# Patient Record
Sex: Male | Born: 1955 | Race: White | Hispanic: Yes | Marital: Married | Smoking: Former smoker
Health system: Southern US, Community
[De-identification: ages and names within clinical notes are randomized; demographics above are authoritative.]

## PROBLEM LIST (undated history)

## (undated) DIAGNOSIS — E119 Type 2 diabetes mellitus without complications: Secondary | ICD-10-CM

## (undated) DIAGNOSIS — I10 Essential (primary) hypertension: Secondary | ICD-10-CM

## (undated) HISTORY — PX: CHOLECYSTECTOMY: SHX55

## (undated) HISTORY — PX: HERNIA REPAIR: SHX51

---

## 2015-06-03 ENCOUNTER — Encounter (HOSPITAL_COMMUNITY): Payer: Self-pay

## 2015-06-03 ENCOUNTER — Emergency Department (HOSPITAL_COMMUNITY)
Admission: EM | Admit: 2015-06-03 | Discharge: 2015-06-03 | Disposition: A | Discharge status: Home / Self Care | Payer: BLUE CROSS/BLUE SHIELD | Attending: Physician Assistant | Admitting: Physician Assistant

## 2015-06-03 DIAGNOSIS — R197 Diarrhea, unspecified: Secondary | ICD-10-CM | POA: Diagnosis not present

## 2015-06-03 DIAGNOSIS — N390 Urinary tract infection, site not specified: Secondary | ICD-10-CM | POA: Diagnosis not present

## 2015-06-03 DIAGNOSIS — Z87891 Personal history of nicotine dependence: Secondary | ICD-10-CM | POA: Diagnosis not present

## 2015-06-03 DIAGNOSIS — Z79899 Other long term (current) drug therapy: Secondary | ICD-10-CM | POA: Insufficient documentation

## 2015-06-03 DIAGNOSIS — I10 Essential (primary) hypertension: Secondary | ICD-10-CM | POA: Diagnosis not present

## 2015-06-03 DIAGNOSIS — E119 Type 2 diabetes mellitus without complications: Secondary | ICD-10-CM | POA: Insufficient documentation

## 2015-06-03 DIAGNOSIS — R112 Nausea with vomiting, unspecified: Secondary | ICD-10-CM | POA: Diagnosis present

## 2015-06-03 HISTORY — DX: Type 2 diabetes mellitus without complications: E11.9

## 2015-06-03 HISTORY — DX: Essential (primary) hypertension: I10

## 2015-06-03 LAB — CBC WITH DIFFERENTIAL/PLATELET
Basophils Absolute: 0 10*3/uL (ref 0.0–0.1)
Basophils Relative: 0 % (ref 0–1)
EOS PCT: 1 % (ref 0–5)
Eosinophils Absolute: 0.1 10*3/uL (ref 0.0–0.7)
HEMATOCRIT: 43.9 % (ref 39.0–52.0)
Hemoglobin: 15.7 g/dL (ref 13.0–17.0)
LYMPHS ABS: 0.8 10*3/uL (ref 0.7–4.0)
LYMPHS PCT: 14 % (ref 12–46)
MCH: 31.7 pg (ref 26.0–34.0)
MCHC: 35.8 g/dL (ref 30.0–36.0)
MCV: 88.7 fL (ref 78.0–100.0)
MONO ABS: 0.3 10*3/uL (ref 0.1–1.0)
MONOS PCT: 5 % (ref 3–12)
Neutro Abs: 4.5 10*3/uL (ref 1.7–7.7)
Neutrophils Relative %: 80 % — ABNORMAL HIGH (ref 43–77)
PLATELETS: 317 10*3/uL (ref 150–400)
RBC: 4.95 MIL/uL (ref 4.22–5.81)
RDW: 13.3 % (ref 11.5–15.5)
WBC: 5.6 10*3/uL (ref 4.0–10.5)

## 2015-06-03 LAB — URINE MICROSCOPIC-ADD ON

## 2015-06-03 LAB — CBG MONITORING, ED
GLUCOSE-CAPILLARY: 204 mg/dL — AB (ref 65–99)
GLUCOSE-CAPILLARY: 275 mg/dL — AB (ref 65–99)
Glucose-Capillary: 307 mg/dL — ABNORMAL HIGH (ref 65–99)

## 2015-06-03 LAB — URINALYSIS, ROUTINE W REFLEX MICROSCOPIC
Bilirubin Urine: NEGATIVE
Hgb urine dipstick: NEGATIVE
Ketones, ur: NEGATIVE mg/dL
Nitrite: NEGATIVE
PH: 5 (ref 5.0–8.0)
Protein, ur: NEGATIVE mg/dL
Specific Gravity, Urine: 1.028 (ref 1.005–1.030)
Urobilinogen, UA: 0.2 mg/dL (ref 0.0–1.0)

## 2015-06-03 LAB — COMPREHENSIVE METABOLIC PANEL
ALT: 91 U/L — ABNORMAL HIGH (ref 17–63)
AST: 53 U/L — ABNORMAL HIGH (ref 15–41)
Albumin: 4.4 g/dL (ref 3.5–5.0)
Alkaline Phosphatase: 71 U/L (ref 38–126)
Anion gap: 10 (ref 5–15)
BUN: 51 mg/dL — ABNORMAL HIGH (ref 6–20)
CO2: 19 mmol/L — ABNORMAL LOW (ref 22–32)
Calcium: 9.4 mg/dL (ref 8.9–10.3)
Chloride: 103 mmol/L (ref 101–111)
Creatinine, Ser: 1.11 mg/dL (ref 0.61–1.24)
GFR calc Af Amer: >60 mL/min (ref >60)
GFR calc non Af Amer: >60 mL/min (ref >60)
Glucose, Bld: 346 mg/dL — ABNORMAL HIGH (ref 65–99)
Potassium: 5.4 mmol/L — ABNORMAL HIGH (ref 3.5–5.1)
Sodium: 132 mmol/L — ABNORMAL LOW (ref 135–145)
Total Bilirubin: 1.5 mg/dL — ABNORMAL HIGH (ref 0.3–1.2)
Total Protein: 8.4 g/dL — ABNORMAL HIGH (ref 6.5–8.1)

## 2015-06-03 LAB — LIPASE, BLOOD: Lipase: 55 U/L — ABNORMAL HIGH (ref 22–51)

## 2015-06-03 MED ORDER — CIPROFLOXACIN HCL 500 MG PO TABS: 500.0000 mg | ORAL_TABLET | Freq: Two times a day (BID) | ORAL | Status: DC

## 2015-06-03 MED ORDER — ONDANSETRON HCL 4 MG/2ML IJ SOLN
4.0000 mg | Freq: Once | INTRAMUSCULAR | Status: AC
  Administered 2015-06-03: 4 mg via INTRAVENOUS
  Filled 2015-06-03: qty 2

## 2015-06-03 MED ORDER — SODIUM CHLORIDE 0.9 % IV BOLUS (SEPSIS)
1000.0000 mL | Freq: Once | INTRAVENOUS | Status: AC
  Administered 2015-06-03: 1000 mL via INTRAVENOUS

## 2015-06-03 NOTE — Discharge Instructions (Signed)
Take all of your antibiotic as prescribed. Return to ED for new or worsening symptoms. Follow-up with your doctor as needed for reevaluation of your symptoms.  Urinary Tract Infection A urinary tract infection (UTI) can occur any place along the urinary tract. The tract includes the kidneys, ureters, bladder, and urethra. A type of germ called bacteria often causes a UTI. UTIs are often helped with antibiotic medicine.  HOME CARE   If given, take antibiotics as told by your doctor. Finish them even if you start to feel better.  Drink enough fluids to keep your pee (urine) clear or pale yellow.  Avoid tea, drinks with caffeine, and bubbly (carbonated) drinks.  Pee often. Avoid holding your pee in for a long time.  Pee before and after having sex (intercourse).  Wipe from front to back after you poop (bowel movement) if you are a woman. Use each tissue only once. GET HELP RIGHT AWAY IF:   You have back pain.  You have lower belly (abdominal) pain.  You have chills.  You feel sick to your stomach (nauseous).  You throw up (vomit).  Your burning or discomfort with peeing does not go away.  You have a fever.  Your symptoms are not better in 3 days. MAKE SURE YOU:   Understand these instructions.  Will watch your condition.  Will get help right away if you are not doing well or get worse. Document Released: 04/08/2008 Document Revised: 07/15/2012 Document Reviewed: 05/21/2012 [facility] Patient Information 2015 [place], [place]. This information is not intended to replace advice given to you by your health care provider. Make sure you discuss any questions you have with your health care provider.

## 2015-06-03 NOTE — ED Notes (Signed)
Pt given sprite and tolerated it. No vomiting.

## 2015-06-03 NOTE — ED Notes (Signed)
He states he has had some epigastric discomfort, plus n/v/d since this Tuesday.  He is in no distress.

## 2015-06-03 NOTE — ED Provider Notes (Signed)
CSN: [id number]     Arrival date & time 06/03/15  1226 History   First MD Initiated Contact with Patient 06/03/15 1230     Chief Complaint  Patient presents with  . Emesis     (Consider location/radiation/quality/duration/timing/severity/associated sxs/prior Treatment) HPI Matthew Kerr is a 59 y.o. male history of diabetes and hypertension who comes in for evaluation of abdominal discomfort, nausea vomiting and diarrhea. Patient states he has had abdominal discomfort that started on Thursday, was fine yesterday, but abdominal discomfort returned today. He cannot characterize this sensation, but reports every time he eats he either "throws up or has diarrhea". He denies any bloody emesis. He reports dark stools yesterday and earlier today, but also reports he has been taking Pepto-Bismol. He reports the Pepto-Bismol helped with his discomfort somewhat. He denies any discomfort now in the ED. Denies fevers, chills, urinary symptoms, chest pain, shortness of breath, numbness or weakness. Denies any recent travel.  Past Medical History  Diagnosis Date  . Diabetes mellitus without complication   . Hypertension    Past Surgical History  Procedure Laterality Date  . Cholecystectomy    . Hernia repair     History reviewed. No pertinent family history. History  Substance Use Topics  . Smoking status: Former Games developer  . Smokeless tobacco: Not on file  . Alcohol Use: No    Review of Systems A 10 point review of systems was completed and was negative except for pertinent positives and negatives as mentioned in the history of present illness     Allergies  Review of patient's allergies indicates no known allergies.  Home Medications   Prior to Admission medications   Medication Sig Start Date End Date Taking? Authorizing Provider  glipiZIDE (GLUCOTROL) 10 MG tablet Take 10 mg by mouth 2 (two) times daily before a meal.  04/09/15  Yes Historical Provider, MD  lisinopril  (PRINIVIL,ZESTRIL) 20 MG tablet Take 20 mg by mouth daily.  05/14/15  Yes Historical Provider, MD  metFORMIN (GLUCOPHAGE) 1000 MG tablet Take 1,000 mg by mouth 2 (two) times daily with a meal.  05/14/15  Yes Historical Provider, MD  pantoprazole (PROTONIX) 40 MG tablet Take 40 mg by mouth daily.  05/06/15  Yes Historical Provider, MD  ciprofloxacin (CIPRO) 500 MG tablet Take 1 tablet (500 mg total) by mouth 2 (two) times daily. 06/03/15   Joycie Peek, PA-C   BP 110/67 mmHg  Pulse 67  Temp(Src) 97.9 F (36.6 C) (Oral)  Resp 16  SpO2 99% Physical Exam  Constitutional: He is oriented to person, place, and time. He appears well-developed and well-nourished.  GCS 15  HENT:  Head: Normocephalic and atraumatic.  Mouth/Throat: Oropharynx is clear and moist.  Eyes: Conjunctivae are normal. Pupils are equal, round, and reactive to light. Right eye exhibits no discharge. Left eye exhibits no discharge. No scleral icterus.  Neck: Neck supple.  Cardiovascular: Normal rate, regular rhythm and normal heart sounds.   Pulmonary/Chest: Effort normal and breath sounds normal. No respiratory distress. He has no wheezes. He has no rales.  Abdominal: Soft. He exhibits no distension and no mass. There is no tenderness. There is no rebound and no guarding.  Musculoskeletal: He exhibits no tenderness.  Neurological: He is alert and oriented to person, place, and time.  Cranial Nerves II-XII grossly intact  Skin: Skin is warm and dry. No rash noted.  Psychiatric: He has a normal mood and affect.  Nursing note and vitals reviewed.   ED Course  Procedures (  including critical care time) Labs Review Labs Reviewed  CBC WITH DIFFERENTIAL/PLATELET - Abnormal; Notable for the following:    Neutrophils Relative % 80 (*)    All other components within normal limits  COMPREHENSIVE METABOLIC PANEL - Abnormal; Notable for the following:    Sodium 132 (*)    Potassium 5.4 (*)    CO2 19 (*)    Glucose, Bld 346 (*)     BUN 51 (*)    Total Protein 8.4 (*)    AST 53 (*)    ALT 91 (*)    Total Bilirubin 1.5 (*)    All other components within normal limits  LIPASE, BLOOD - Abnormal; Notable for the following:    Lipase 55 (*)    All other components within normal limits  URINALYSIS, ROUTINE W REFLEX MICROSCOPIC (NOT AT [facility]) - Abnormal; Notable for the following:    Glucose, UA >1000 (*)    Leukocytes, UA TRACE (*)    All other components within normal limits  URINE MICROSCOPIC-ADD ON - Abnormal; Notable for the following:    Casts GRANULAR CAST (*)    All other components within normal limits  CBG MONITORING, ED - Abnormal; Notable for the following:    Glucose-Capillary 307 (*)    All other components within normal limits  CBG MONITORING, ED - Abnormal; Notable for the following:    Glucose-Capillary 204 (*)    All other components within normal limits  CBG MONITORING, ED - Abnormal; Notable for the following:    Glucose-Capillary 275 (*)    All other components within normal limits    Imaging Review No results found.   EKG Interpretation None     Meds given in ED:  Medications  ondansetron (ZOFRAN) injection 4 mg (4 mg Intravenous Given 06/03/15 1329)  sodium chloride 0.9 % bolus 1,000 mL (0 mLs Intravenous Stopped 06/03/15 1413)    Discharge Medication List as of 06/03/2015  3:21 PM    START taking these medications   Details  ciprofloxacin (CIPRO) 500 MG tablet Take 1 tablet (500 mg total) by mouth 2 (two) times daily., Starting 06/03/2015, Until Discontinued, Print       Filed Vitals:   06/03/15 1238 06/03/15 1537  BP: 117/77 110/67  Pulse: 113 67  Temp: 98.1 F (36.7 C) 97.9 F (36.6 C)  TempSrc: Oral Oral  Resp: 16 16  SpO2: 97% 99%    MDM  Vitals stable -afebrile, tachycardia improving in ED with IV fluids. Pt resting comfortably in ED. states he feels much better and has no discomfort. No nausea, vomiting or diarrhea in the ED. He is tolerating PO. Suspect slight  dehydration secondary to hyperglycemia. PE--benign abdominal exam. Physical exam is otherwise not concerning. Labwork--evidence of UTI on urinalysis with trace leukocytes, 11-20 white cells. CBG 300 on arrival, on repeat CBG, improved to 204 with fluids.  Patient with known diabetes and reports he has not taken any of his medications today. He otherwise appears well and is requesting to go home. I see no emergent reason to keep him in the ED. We'll treat for UTI with Cipro and have him follow-up with primary care. I discussed all relevant lab findings and imaging results with pt and they verbalized understanding. Discussed f/u with PCP within 48 hrs and return precautions, pt very amenable to plan.   Final diagnoses:  UTI (lower urinary tract infection)       Joycie Peek, PA-C 06/04/15 1610  Courteney Randall An, MD 06/04/15  0903 

## 2015-06-22 ENCOUNTER — Encounter (HOSPITAL_COMMUNITY): Payer: Self-pay

## 2015-06-22 ENCOUNTER — Inpatient Hospital Stay (HOSPITAL_COMMUNITY): Payer: BLUE CROSS/BLUE SHIELD

## 2015-06-22 ENCOUNTER — Emergency Department (HOSPITAL_COMMUNITY): Payer: BLUE CROSS/BLUE SHIELD

## 2015-06-22 ENCOUNTER — Inpatient Hospital Stay (HOSPITAL_COMMUNITY)
Admission: EM | Admit: 2015-06-22 | Discharge: 2015-06-26 | DRG: 683 | Disposition: A | Discharge status: Home / Self Care | Payer: BLUE CROSS/BLUE SHIELD | Attending: Internal Medicine | Admitting: Internal Medicine

## 2015-06-22 DIAGNOSIS — E871 Hypo-osmolality and hyponatremia: Secondary | ICD-10-CM | POA: Diagnosis present

## 2015-06-22 DIAGNOSIS — R52 Pain, unspecified: Secondary | ICD-10-CM | POA: Diagnosis not present

## 2015-06-22 DIAGNOSIS — N170 Acute kidney failure with tubular necrosis: Secondary | ICD-10-CM | POA: Diagnosis present

## 2015-06-22 DIAGNOSIS — R509 Fever, unspecified: Secondary | ICD-10-CM

## 2015-06-22 DIAGNOSIS — E860 Dehydration: Secondary | ICD-10-CM | POA: Diagnosis present

## 2015-06-22 DIAGNOSIS — A047 Enterocolitis due to Clostridium difficile: Secondary | ICD-10-CM | POA: Diagnosis present

## 2015-06-22 DIAGNOSIS — IMO0002 Reserved for concepts with insufficient information to code with codable children: Secondary | ICD-10-CM | POA: Diagnosis present

## 2015-06-22 DIAGNOSIS — E861 Hypovolemia: Secondary | ICD-10-CM | POA: Diagnosis present

## 2015-06-22 DIAGNOSIS — E440 Moderate protein-calorie malnutrition: Secondary | ICD-10-CM | POA: Diagnosis present

## 2015-06-22 DIAGNOSIS — K219 Gastro-esophageal reflux disease without esophagitis: Secondary | ICD-10-CM | POA: Diagnosis present

## 2015-06-22 DIAGNOSIS — I959 Hypotension, unspecified: Secondary | ICD-10-CM | POA: Diagnosis present

## 2015-06-22 DIAGNOSIS — A048 Other specified bacterial intestinal infections: Secondary | ICD-10-CM | POA: Diagnosis not present

## 2015-06-22 DIAGNOSIS — E872 Acidosis: Secondary | ICD-10-CM | POA: Diagnosis present

## 2015-06-22 DIAGNOSIS — N179 Acute kidney failure, unspecified: Secondary | ICD-10-CM | POA: Diagnosis present

## 2015-06-22 DIAGNOSIS — Z79899 Other long term (current) drug therapy: Secondary | ICD-10-CM | POA: Diagnosis not present

## 2015-06-22 DIAGNOSIS — E1165 Type 2 diabetes mellitus with hyperglycemia: Secondary | ICD-10-CM | POA: Diagnosis present

## 2015-06-22 DIAGNOSIS — E875 Hyperkalemia: Secondary | ICD-10-CM | POA: Diagnosis present

## 2015-06-22 DIAGNOSIS — Z87891 Personal history of nicotine dependence: Secondary | ICD-10-CM

## 2015-06-22 DIAGNOSIS — I10 Essential (primary) hypertension: Secondary | ICD-10-CM | POA: Diagnosis present

## 2015-06-22 LAB — COMPREHENSIVE METABOLIC PANEL
ALT: 53 U/L (ref 17–63)
AST: 50 U/L — ABNORMAL HIGH (ref 15–41)
Albumin: 4.5 g/dL (ref 3.5–5.0)
Alkaline Phosphatase: 64 U/L (ref 38–126)
Anion gap: 29 — ABNORMAL HIGH (ref 5–15)
BUN: 119 mg/dL — ABNORMAL HIGH (ref 6–20)
CHLORIDE: 88 mmol/L — AB (ref 101–111)
CO2: 15 mmol/L — AB (ref 22–32)
Calcium: 8.1 mg/dL — ABNORMAL LOW (ref 8.9–10.3)
Creatinine, Ser: 11.96 mg/dL — ABNORMAL HIGH (ref 0.61–1.24)
GFR, EST AFRICAN AMERICAN: 5 mL/min — AB (ref >60)
GFR, EST NON AFRICAN AMERICAN: 4 mL/min — AB (ref >60)
Glucose, Bld: 223 mg/dL — ABNORMAL HIGH (ref 65–99)
POTASSIUM: 7 mmol/L — AB (ref 3.5–5.1)
Sodium: 132 mmol/L — ABNORMAL LOW (ref 135–145)
Total Bilirubin: 1.2 mg/dL (ref 0.3–1.2)
Total Protein: 8.3 g/dL — ABNORMAL HIGH (ref 6.5–8.1)

## 2015-06-22 LAB — GLUCOSE, CAPILLARY
GLUCOSE-CAPILLARY: 103 mg/dL — AB (ref 65–99)
Glucose-Capillary: 74 mg/dL (ref 65–99)
Glucose-Capillary: 108 mg/dL — ABNORMAL HIGH (ref 65–99)
Glucose-Capillary: 103 mg/dL — ABNORMAL HIGH (ref 65–99)
Glucose-Capillary: 95 mg/dL (ref 65–99)

## 2015-06-22 LAB — CK TOTAL AND CKMB (NOT AT ARMC)
CK TOTAL: 1850 U/L — AB (ref 49–397)
CK, MB: 40.9 ng/mL — AB (ref 0.5–5.0)
Relative Index: 2.2 (ref 0.0–2.5)

## 2015-06-22 LAB — CBC
HEMATOCRIT: 42.1 % (ref 39.0–52.0)
HEMATOCRIT: 37.6 % — AB (ref 39.0–52.0)
HEMOGLOBIN: 14.7 g/dL (ref 13.0–17.0)
HEMOGLOBIN: 13 g/dL (ref 13.0–17.0)
MCH: 30.9 pg (ref 26.0–34.0)
MCH: 30.8 pg (ref 26.0–34.0)
MCHC: 34.9 g/dL (ref 30.0–36.0)
MCHC: 34.6 g/dL (ref 30.0–36.0)
MCV: 88.6 fL (ref 78.0–100.0)
MCV: 89.1 fL (ref 78.0–100.0)
Platelets: 404 10*3/uL — ABNORMAL HIGH (ref 150–400)
Platelets: 375 10*3/uL (ref 150–400)
RBC: 4.75 MIL/uL (ref 4.22–5.81)
RBC: 4.22 MIL/uL (ref 4.22–5.81)
RDW: 13.4 % (ref 11.5–15.5)
RDW: 13.7 % (ref 11.5–15.5)
WBC: 11.3 10*3/uL — AB (ref 4.0–10.5)
WBC: 12 10*3/uL — AB (ref 4.0–10.5)

## 2015-06-22 LAB — URINE MICROSCOPIC-ADD ON

## 2015-06-22 LAB — CBG MONITORING, ED
GLUCOSE-CAPILLARY: 59 mg/dL — AB (ref 65–99)
GLUCOSE-CAPILLARY: 72 mg/dL (ref 65–99)
Glucose-Capillary: 61 mg/dL — ABNORMAL LOW (ref 65–99)

## 2015-06-22 LAB — BASIC METABOLIC PANEL
ANION GAP: 26 — AB (ref 5–15)
BUN: 123 mg/dL — ABNORMAL HIGH (ref 6–20)
CHLORIDE: 94 mmol/L — AB (ref 101–111)
CO2: 15 mmol/L — AB (ref 22–32)
Calcium: 7 mg/dL — ABNORMAL LOW (ref 8.9–10.3)
Creatinine, Ser: 11.76 mg/dL — ABNORMAL HIGH (ref 0.61–1.24)
GFR calc Af Amer: 5 mL/min — ABNORMAL LOW (ref >60)
GFR, EST NON AFRICAN AMERICAN: 4 mL/min — AB (ref >60)
GLUCOSE: 64 mg/dL — AB (ref 65–99)
POTASSIUM: 4.7 mmol/L (ref 3.5–5.1)
Sodium: 135 mmol/L (ref 135–145)

## 2015-06-22 LAB — URINALYSIS, ROUTINE W REFLEX MICROSCOPIC
GLUCOSE, UA: NEGATIVE mg/dL
Ketones, ur: NEGATIVE mg/dL
NITRITE: NEGATIVE
PH: 5 (ref 5.0–8.0)
Protein, ur: 30 mg/dL — AB
Specific Gravity, Urine: 1.021 (ref 1.005–1.030)
Urobilinogen, UA: 0.2 mg/dL (ref 0.0–1.0)

## 2015-06-22 LAB — DIFFERENTIAL
Basophils Absolute: 0 10*3/uL (ref 0.0–0.1)
Basophils Relative: 0 % (ref 0–1)
Eosinophils Absolute: 0 10*3/uL (ref 0.0–0.7)
Eosinophils Relative: 0 % (ref 0–5)
LYMPHS PCT: 17 % (ref 12–46)
Lymphs Abs: 2.1 10*3/uL (ref 0.7–4.0)
MONO ABS: 1.5 10*3/uL — AB (ref 0.1–1.0)
MONOS PCT: 12 % (ref 3–12)
NEUTROS ABS: 8.4 10*3/uL — AB (ref 1.7–7.7)
Neutrophils Relative %: 71 % (ref 43–77)

## 2015-06-22 LAB — TROPONIN I: Troponin I: <0.03 ng/mL (ref <0.031)

## 2015-06-22 LAB — C DIFFICILE QUICK SCREEN W PCR REFLEX
C DIFFICILE (CDIFF) INTERP: POSITIVE
C DIFFICILE (CDIFF) TOXIN: POSITIVE — AB
C Diff antigen: POSITIVE — AB

## 2015-06-22 LAB — MAGNESIUM: Magnesium: 1.6 mg/dL — ABNORMAL LOW (ref 1.7–2.4)

## 2015-06-22 LAB — LIPASE, BLOOD: LIPASE: 42 U/L (ref 22–51)

## 2015-06-22 LAB — CREATININE, URINE, RANDOM: Creatinine, Urine: 239.78 mg/dL

## 2015-06-22 LAB — TSH: TSH: 0.415 u[IU]/mL (ref 0.350–4.500)

## 2015-06-22 LAB — SODIUM, URINE, RANDOM: Sodium, Ur: 19 mmol/L

## 2015-06-22 MED ORDER — SODIUM CHLORIDE 0.9 % IJ SOLN
3.0000 mL | Freq: Two times a day (BID) | INTRAMUSCULAR | Status: DC
  Administered 2015-06-24: 3 mL via INTRAVENOUS

## 2015-06-22 MED ORDER — HYDROMORPHONE HCL 1 MG/ML IJ SOLN
1.0000 mg | INTRAMUSCULAR | Status: DC | PRN
  Administered 2015-06-22: 1 mg via INTRAVENOUS
  Filled 2015-06-22: qty 1

## 2015-06-22 MED ORDER — INSULIN ASPART 100 UNIT/ML ~~LOC~~ SOLN
10.0000 [IU] | Freq: Once | SUBCUTANEOUS | Status: AC
  Administered 2015-06-22: 10 [IU] via SUBCUTANEOUS
  Filled 2015-06-22: qty 1

## 2015-06-22 MED ORDER — DEXTROSE 50 % IV SOLN
1.0000 | Freq: Once | INTRAVENOUS | Status: AC
  Administered 2015-06-22: 50 mL via INTRAVENOUS
  Filled 2015-06-22: qty 50

## 2015-06-22 MED ORDER — SODIUM CHLORIDE 0.9 % IV BOLUS (SEPSIS)
1000.0000 mL | Freq: Once | INTRAVENOUS | Status: AC
  Administered 2015-06-22: 1000 mL via INTRAVENOUS

## 2015-06-22 MED ORDER — SODIUM CHLORIDE 0.9 % IV SOLN: INTRAVENOUS | Status: DC

## 2015-06-22 MED ORDER — SODIUM POLYSTYRENE SULFONATE 15 GM/60ML PO SUSP
30.0000 g | Freq: Once | ORAL | Status: AC
  Administered 2015-06-22: 30 g via ORAL
  Filled 2015-06-22: qty 120

## 2015-06-22 MED ORDER — LEVALBUTEROL HCL 0.63 MG/3ML IN NEBU: 0.6300 mg | INHALATION_SOLUTION | Freq: Four times a day (QID) | RESPIRATORY_TRACT | Status: DC | PRN

## 2015-06-22 MED ORDER — SODIUM CHLORIDE 0.9 % IV SOLN
INTRAVENOUS | Status: DC
  Filled 2015-06-22: qty 2.5

## 2015-06-22 MED ORDER — ACETAMINOPHEN 650 MG RE SUPP
650.0000 mg | Freq: Four times a day (QID) | RECTAL | Status: DC | PRN
Start: 2015-06-22 — End: 2015-06-26

## 2015-06-22 MED ORDER — STERILE WATER FOR INJECTION IV SOLN
INTRAVENOUS | Status: DC
  Administered 2015-06-22 – 2015-06-24 (×6): via INTRAVENOUS
  Filled 2015-06-22 (×12): qty 850

## 2015-06-22 MED ORDER — HYDROMORPHONE HCL 1 MG/ML IJ SOLN
1.0000 mg | Freq: Once | INTRAMUSCULAR | Status: AC
  Administered 2015-06-22: 1 mg via INTRAVENOUS
  Filled 2015-06-22: qty 1

## 2015-06-22 MED ORDER — HEPARIN SODIUM (PORCINE) 5000 UNIT/ML IJ SOLN
5000.0000 [IU] | Freq: Three times a day (TID) | INTRAMUSCULAR | Status: DC
Start: 2015-06-22 — End: 2015-06-22
  Filled 2015-06-22 (×2): qty 1

## 2015-06-22 MED ORDER — SUCRALFATE 1 G PO TABS
1.0000 g | ORAL_TABLET | Freq: Three times a day (TID) | ORAL | Status: DC
  Administered 2015-06-22 – 2015-06-26 (×16): 1 g via ORAL
  Filled 2015-06-22 (×16): qty 1

## 2015-06-22 MED ORDER — SODIUM CHLORIDE 0.9 % IV SOLN
INTRAVENOUS | Status: DC
  Administered 2015-06-22: 14:00:00 via INTRAVENOUS

## 2015-06-22 MED ORDER — HEPARIN SODIUM (PORCINE) 5000 UNIT/ML IJ SOLN
5000.0000 [IU] | Freq: Three times a day (TID) | INTRAMUSCULAR | Status: DC
  Administered 2015-06-22 – 2015-06-26 (×7): 5000 [IU] via SUBCUTANEOUS
  Filled 2015-06-22 (×8): qty 1

## 2015-06-22 MED ORDER — ACETAMINOPHEN 325 MG PO TABS: 650.0000 mg | ORAL_TABLET | Freq: Four times a day (QID) | ORAL | Status: DC | PRN

## 2015-06-22 MED ORDER — IOHEXOL 300 MG/ML  SOLN
50.0000 mL | Freq: Once | INTRAMUSCULAR | Status: AC | PRN
  Administered 2015-06-22: 50 mL via ORAL

## 2015-06-22 MED ORDER — DEXTROSE-NACL 5-0.45 % IV SOLN: INTRAVENOUS | Status: DC

## 2015-06-22 MED ORDER — ONDANSETRON HCL 4 MG PO TABS
4.0000 mg | ORAL_TABLET | Freq: Four times a day (QID) | ORAL | Status: DC | PRN
  Administered 2015-06-23: 4 mg via ORAL
  Filled 2015-06-22: qty 1

## 2015-06-22 MED ORDER — VANCOMYCIN 50 MG/ML ORAL SOLUTION
250.0000 mg | Freq: Four times a day (QID) | ORAL | Status: DC
  Administered 2015-06-22 – 2015-06-24 (×7): 250 mg via ORAL
  Filled 2015-06-22 (×12): qty 5

## 2015-06-22 MED ORDER — ONDANSETRON HCL 4 MG/2ML IJ SOLN
4.0000 mg | Freq: Once | INTRAMUSCULAR | Status: AC
  Administered 2015-06-22: 4 mg via INTRAVENOUS
  Filled 2015-06-22: qty 2

## 2015-06-22 MED ORDER — ONDANSETRON HCL 4 MG/2ML IJ SOLN
4.0000 mg | Freq: Four times a day (QID) | INTRAMUSCULAR | Status: DC | PRN
  Administered 2015-06-22: 4 mg via INTRAVENOUS
  Filled 2015-06-22: qty 2

## 2015-06-22 NOTE — ED Notes (Addendum)
Patient c/o N/V/D with abdominal pain in lower mid-abdomen for several days.  Patient denies blood in stool.  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  Patient states several people at work have been sick as well.    On exam, patient's lung sounds are clear in all lobes.  Heart sounds WNL, S1/S2.  No murmur, rub or gallop noted.  Patient's abdomen is soft and mildly tender to palpation.

## 2015-06-22 NOTE — ED Notes (Signed)
Pt was given orange juice and sandwich

## 2015-06-22 NOTE — ED Notes (Signed)
Bladder scanner per Dr. Freida Busman. Pt shows 40ml. States he does not have to go to RR or the urge

## 2015-06-22 NOTE — Progress Notes (Signed)
06/22/15 Call placed to [facility] Emergency Room was on hold for 20 minutes, the called back was told they were busy would call me back.

## 2015-06-22 NOTE — Progress Notes (Signed)
Pt arrived to unit alert and oriented x4 via carelink from [facility] ED. Oriented to room, unit, and staff.  Bed in lowest position and call bell is within reach. Will continue to monitor.

## 2015-06-22 NOTE — H&P (Addendum)
[facility] History and Physical  Matthew Kerr BJY:[id number] DOB: Jun 29, 1956 DOA: 06/22/2015  Referring physician:   PCP: PROVIDER NOT IN SYSTEM   Chief Complaint: *Abdominal pain, nausea  HPI:  59 year old male with a history of type 2 diabetes, hypertension, presented with nausea vomiting and diarrhea on 7/30. The patient's workup revealed that he had nonbloody emesis, diarrhea for which she was taking Pepto-Bismol, potassium 5.4, creatinine 1.1, 11-20 white blood cells, trace UTI, CBG 300, discharge from the ED on 7/30 on by mouth ciprofloxacin to follow-up with his primary care provider. Patient is on metformin , lisinopril, glipizide at home. Patient states that he continued to have nausea vomiting and diffuse abdominal pain for several days up until yesterday he was still having diarrhea. He has been working outdoors in [employer] for a few weeks. He took one single dose of ibuprofen 200 mg 2 days ago. Denies any fever chills or rigors. In the emergency room-concern is raised with his elevated creatinine of 12 and hyperkalemia of 7 with metabolic acidosis. CBG 307 upon arrival. Patient evaluated by nephrology , he is being transferred to [facility] for his acute renal failure. Patient found to have an anion gap of 29.       Review of Systems: negative for the following  : Positive for malaise/fatigue. Negative for fever, chills and diaphoresis.  HEENT: Denies photophobia, eye pain, redness, hearing loss, ear pain, congestion, sore throat, rhinorrhea, sneezing, mouth sores, trouble swallowing, neck pain, neck stiffness and tinnitus.  Respiratory: Denies SOB, DOE, cough, chest tightness, and wheezing.  Cardiovascular: Denies chest pain, palpitations and leg swelling.  Gastrointestinal: Positive for nausea, vomiting and abdominal pain.   Hiccups  Pain over right lower quadrant Genitourinary: Denies dysuria, urgency, frequency, hematuria, flank pain and  difficulty urinating.  Musculoskeletal: Denies myalgias, back pain, joint swelling, arthralgias and gait problem.  Skin: Denies pallor, rash and wound.  Neurological:Neurological: Positive for dizziness and weakness numbness and headaches.  Hematological: Denies adenopathy. Easy bruising, personal or family bleeding history  Psychiatric/Behavioral: Denies suicidal ideation, mood changes, confusion, nervousness, sleep disturbance and agitation       Past Medical History  Diagnosis Date  . Diabetes mellitus without complication   . Hypertension      Past Surgical History  Procedure Laterality Date  . Cholecystectomy    . Hernia repair        Social History:  reports that he has quit smoking. He does not have any smokeless tobacco history on file. He reports that he does not drink alcohol or use illicit drugs.    No Known Allergies      FAMILY HISTORY  When questioned  Directly-patient reports  No family history of HTN, CVA ,DIABETES, TB, Cancer CAD, Bleeding Disorders, Sickle Cell, diabetes, anemia, asthma,   Prior to Admission medications   Medication Sig Start Date End Date Taking? Authorizing Provider  glipiZIDE (GLUCOTROL) 10 MG tablet Take 10 mg by mouth 2 (two) times daily before a meal.  04/09/15  Yes Historical Provider, MD  lisinopril (PRINIVIL,ZESTRIL) 20 MG tablet Take 20 mg by mouth daily.  05/14/15  Yes Historical Provider, MD  metFORMIN (GLUCOPHAGE) 1000 MG tablet Take 1,000 mg by mouth 2 (two) times daily with a meal.  05/14/15  Yes Historical Provider, MD  pantoprazole (PROTONIX) 40 MG tablet Take 40 mg by mouth daily.  05/06/15  Yes Historical Provider, MD  ciprofloxacin (CIPRO) 500 MG tablet Take 1 tablet (500 mg total) by mouth 2 (two) times  daily. Patient not taking: Reported on 06/22/2015 06/03/15   Joycie Peek, PA-C     Physical Exam: Filed Vitals:   06/22/15 1052 06/22/15 1316 06/22/15 1358 06/22/15 1456  BP: 103/60 107/49 105/51 92/49  Pulse: 86  72 78 78  Temp: 98.4 F (36.9 C)     TempSrc: Oral     Resp: SpO2: 97% 99%       Constitutional: Vital signs reviewed. Patient is a well-developed and well-nourished in no acute distress and cooperative with exam. Alert and oriented x3.  Head: Normocephalic and atraumatic  Ear: TM normal bilaterally  Dry oral mucosa/oropharynx  Eyes: PERRL, EOMI, conjunctivae normal, No scleral icterus.  Neck: Supple, Trachea midline normal ROM, No JVD, mass, thyromegaly, or carotid bruit present.  Cardiovascular: RRR, S1 normal, S2 normal, no MRG, pulses symmetric and intact bilaterally  Pulmonary/Chest: CTAB, no wheezes, rales, or rhonchi  Abdominal: Soft. Non-tender, non-distended, bowel sounds are normal, no masses, organomegaly, or guarding present.  GU: no CVA tenderness Musculoskeletal: No joint deformities, erythema, or stiffness, ROM full and no nontender Ext: no edema and no cyanosis, pulses palpable bilaterally (DP and PT)  Hematology: no cervical, inginal, or axillary adenopathy.  Neurological: A&O x3, Strenght is normal and symmetric bilaterally, cranial nerve II-XII are grossly intact, no focal motor deficit, sensory intact to light touch bilaterally.  Skin: Warm, dry and intact. No rash, cyanosis, or clubbing.  Psychiatric: Normal mood and affect. speech and behavior is normal. Judgment and thought content normal. Cognition and memory are normal.      Data Review   Micro Results No results found for this or any previous visit (from the past 240 hour(s)).  Radiology Reports Ct Abdomen Pelvis Wo Contrast  06/22/2015   CLINICAL DATA:  Abdominal pain nausea, vomiting, and diarrhea for several days  EXAM: CT ABDOMEN AND PELVIS WITHOUT CONTRAST  TECHNIQUE: Multidetector CT imaging of the abdomen and pelvis was performed following the standard protocol without IV contrast. Oral contrast was administered.  COMPARISON:  None.  FINDINGS: There is slight bibasilar lung atelectasis.  Lung bases are otherwise are clear. There is a small amount of contrast in the distal esophagus, likely spontaneous gastroesophageal reflux.  Liver shows decreased attenuation, consistent with a degree of hepatic steatosis. No focal liver lesions are identified on this nonintravenous contrast enhanced study. Gallbladder is absent. There is no biliary duct dilatation.  Spleen, pancreas, and adrenals appear normal.  Kidneys bilaterally show no mass or hydronephrosis on either side. There are no renal or ureteral calculi on either side.  In the pelvis, the urinary bladder is midline with wall thickness within normal limits. There are multiple prostatic calculi present. There is a slight degree of fat in the left inguinal ring. There are sigmoid diverticula without diverticulitis. There is no pelvic mass or pelvic fluid collection. The appendix appears within normal limits.  There are scattered diverticula throughout the remainder the colon without diverticulitis. There is no bowel obstruction. No free air or portal venous air. There is no demonstrable ascites, adenopathy, or abscess in the abdomen or pelvis. There is no abdominal aortic aneurysm. There is degenerative change in the lumbar spine. No blastic or lytic bone lesions.  IMPRESSION: Scattered colonic diverticula without diverticulitis. No bowel obstruction. No abscess. Appendix appears normal.  No renal or ureteral calculus.  No hydronephrosis.  Multiple prostatic calculi.  Contrast in the distal esophagus is felt to represent a degree of spontaneous gastroesophageal reflux.  There is  a degree of hepatic steatosis.  Gallbladder absent.   Electronically Signed   By: Bretta Bang III M.D.   On: 06/22/2015 14:39     CBC  Recent Labs Lab 06/22/15 1115  WBC 11.3*  HGB 14.7  HCT 42.1  PLT 404*  MCV 88.6  MCH 30.9  MCHC 34.9  RDW 13.4    Chemistries   Recent Labs Lab 06/22/15 1115  NA 132*  K 7.0*  CL 88*  CO2 15*  GLUCOSE 223*  BUN  119*  CREATININE 11.96*  CALCIUM 8.1*  AST 50*  ALT 53  ALKPHOS 64  BILITOT 1.2   ------------------------------------------------------------------------------------------------------------------ CrCl cannot be calculated (Unknown ideal weight.). ------------------------------------------------------------------------------------------------------------------ No results for input(s): HGBA1C in the last 72 hours. ------------------------------------------------------------------------------------------------------------------ No results for input(s): CHOL, HDL, LDLCALC, TRIG, CHOLHDL, LDLDIRECT in the last 72 hours. ------------------------------------------------------------------------------------------------------------------ No results for input(s): TSH, T4TOTAL, T3FREE, THYROIDAB in the last 72 hours.  Invalid input(s): FREET3 ------------------------------------------------------------------------------------------------------------------ No results for input(s): VITAMINB12, FOLATE, FERRITIN, TIBC, IRON, RETICCTPCT in the last 72 hours.  Coagulation profile No results for input(s): INR, PROTIME in the last 168 hours.  No results for input(s): DDIMER in the last 72 hours.  Cardiac Enzymes No results for input(s): CKMB, TROPONINI, MYOGLOBIN in the last 168 hours.  Invalid input(s): CK ------------------------------------------------------------------------------------------------------------------ Invalid input(s): POCBNP   CBG: No results for input(s): GLUCAP in the last 168 hours.     EKG: Independently reviewed. *   Assessment/Plan Principal Problem:   Acute kidney injury-prerenal in the setting of recent gastroenteritis , NSAID use, ACE inhibitor use, Vs interstitial nephritis, CT does not show hydronephrosis, repeat UA pending patient has no urine output in the ED despite 2 L of fluid boluses. His condom catheter for strict I's and O's.  Hyperkalemia in the  setting of acute renal failure with IV insulin, bicarbonate, Kayexalate, hold ACE inhibitor at this time   Hypertension, essential, benign-hold all antihypertensives medications until renal function improves and the patient's as becoming hypotensive, continue with aggressive fluid resuscitation     Diabetes type 2, uncontrolled-with mild DKA elevated anion gap, patient currently on a bicarbonate drip, will repeat serial BMP, continue insulin drip for now and transition to sliding scale insulin when anion gap closes.   Diarrhea-will order stool studies and take enteric precautions keep nothing by mouth until symptoms improve  Code Status:   full Family Communication: bedside Disposition Plan: admit   Total time spent 55 minutes.Greater than 50% of this time was spent in counseling, explanation of diagnosis, planning of further management, and coordination of care  [facility] Pager [phone]  If 7PM-7AM, please contact night-coverage [email or web address] Password [facility] 06/22/2015, 4:17 PM

## 2015-06-22 NOTE — ED Notes (Signed)
Pt c/o generalized abdominal pain x 1 week and n/v/d x 2 days.  Pain score 8/10.

## 2015-06-22 NOTE — ED Provider Notes (Signed)
CSN: [id number]     Arrival date & time 06/22/15  1042 History   First MD Initiated Contact with Patient 06/22/15 1147     Chief Complaint  Patient presents with  . Diarrhea  . Emesis  . Abdominal Pain     (Consider location/radiation/quality/duration/timing/severity/associated sxs/prior Treatment) HPI Comments: Sx worse after eating and pt has used pepto-bismol without relief  Patient is a 59 y.o. male presenting with diarrhea, vomiting, and abdominal pain. The history is provided by the patient.  Diarrhea Associated symptoms: abdominal pain and vomiting   Abdominal pain:    Location:  Epigastric   Quality:  Burning   Severity:  Moderate   Onset quality:  Sudden   Duration:  1 week   Timing:  Constant   Progression:  Worsening   Chronicity:  Recurrent Vomiting:    Quality:  Bilious material   Severity:  Mild   Duration:  1 week   Timing:  Constant   Progression:  Worsening Emesis Associated symptoms: abdominal pain and diarrhea   Abdominal Pain Associated symptoms: diarrhea and vomiting     Past Medical History  Diagnosis Date  . Diabetes mellitus without complication   . Hypertension    Past Surgical History  Procedure Laterality Date  . Cholecystectomy    . Hernia repair     History reviewed. No pertinent family history. Social History  Substance Use Topics  . Smoking status: Former Games developer  . Smokeless tobacco: None  . Alcohol Use: No    Review of Systems  Gastrointestinal: Positive for vomiting, abdominal pain and diarrhea.  All other systems reviewed and are negative.     Allergies  Review of patient's allergies indicates no known allergies.  Home Medications   Prior to Admission medications   Medication Sig Start Date End Date Taking? Authorizing Provider  ciprofloxacin (CIPRO) 500 MG tablet Take 1 tablet (500 mg total) by mouth 2 (two) times daily. 06/03/15   Joycie Peek, PA-C  glipiZIDE (GLUCOTROL) 10 MG tablet Take 10 mg by mouth 2  (two) times daily before a meal.  04/09/15   Historical Provider, MD  lisinopril (PRINIVIL,ZESTRIL) 20 MG tablet Take 20 mg by mouth daily.  05/14/15   Historical Provider, MD  metFORMIN (GLUCOPHAGE) 1000 MG tablet Take 1,000 mg by mouth 2 (two) times daily with a meal.  05/14/15   Historical Provider, MD  pantoprazole (PROTONIX) 40 MG tablet Take 40 mg by mouth daily.  05/06/15   Historical Provider, MD   BP 103/60 mmHg  Pulse 86  Temp(Src) 98.4 F (36.9 C) (Oral)  Resp 16  SpO2 97% Physical Exam  Constitutional: He is oriented to person, place, and time. He appears well-developed and well-nourished.  Non-toxic appearance. No distress.  HENT:  Head: Normocephalic and atraumatic.  Eyes: Conjunctivae, EOM and lids are normal. Pupils are equal, round, and reactive to light.  Neck: Normal range of motion. Neck supple. No tracheal deviation present. No thyroid mass present.  Cardiovascular: Normal rate, regular rhythm and normal heart sounds.  Exam reveals no gallop.   No murmur heard. Pulmonary/Chest: Effort normal and breath sounds normal. No stridor. No respiratory distress. He has no decreased breath sounds. He has no wheezes. He has no rhonchi. He has no rales.  Abdominal: Soft. Normal appearance and bowel sounds are normal. He exhibits no distension. There is tenderness in the epigastric area and periumbilical area. There is no rigidity, no rebound, no guarding and no CVA tenderness.    Musculoskeletal: Normal  range of motion. He exhibits no edema or tenderness.  Neurological: He is alert and oriented to person, place, and time. He has normal strength. No cranial nerve deficit or sensory deficit. GCS eye subscore is 4. GCS verbal subscore is 5. GCS motor subscore is 6.  Skin: Skin is warm and dry. No abrasion and no rash noted.  Psychiatric: He has a normal mood and affect. His speech is normal and behavior is normal.  Nursing note and vitals reviewed.   ED Course  Procedures (including  critical care time) Labs Review Labs Reviewed  CBC - Abnormal; Notable for the following:    WBC 11.3 (*)    Platelets 404 (*)    All other components within normal limits  LIPASE, BLOOD  COMPREHENSIVE METABOLIC PANEL  URINALYSIS, ROUTINE W REFLEX MICROSCOPIC (NOT AT [facility])    Imaging Review No results found. I have personally reviewed and evaluated these images and lab results as part of my medical decision-making.   EKG Interpretation   Date/Time:  Thursday June 22 2015 12:59:28 EDT Ventricular Rate:  72 PR Interval:  183 QRS Duration: 108 QT Interval:  412 QTC Calculation: 451 R Axis:   -68 Text Interpretation:  Sinus rhythm Low voltage, extremity and precordial  leads Consider anterior infarct ST elevation, consider inferior injury  Confirmed by Yianna Tersigni  MD, Sela Falk ([id number]) on 06/22/2015 1:26:30 PM      MDM   Final diagnoses:  None   patient's labs noted and shows evidence of renal failure. He had EKG was did show some peaked T waves. Patient has evidence of hyperkalemia and this was treated with insulin, glucose, D50. Abdominal CT without acute surgical pathology. Patient had a bladder scan done by nursing was no show any evidence of urinary retention. I spoke with nephrologist who requests hospital admission and he will follow along.  CRITICAL CARE Performed by: Toy Baker Total critical care time: 50 Critical care time was exclusive of separately billable procedures and treating other patients. Critical care was necessary to treat or prevent imminent or life-threatening deterioration. Critical care was time spent personally by me on the following activities: development of treatment plan with patient and/or surrogate as well as nursing, discussions with consultants, evaluation of patient's response to treatment, examination of patient, obtaining history from patient or surrogate, ordering and performing treatments and interventions, ordering and review of  laboratory studies, ordering and review of radiographic studies, pulse oximetry and re-evaluation of patient's condition.   Lorre Nick, MD 06/22/15 1524

## 2015-06-22 NOTE — ED Notes (Signed)
Critical value called by lab, patient's K+ 7.  EDP Allen informed.

## 2015-06-22 NOTE — ED Notes (Signed)
Patient given 4 ounces of juice. CBG will be rechecked.

## 2015-06-22 NOTE — Consult Note (Addendum)
Reason for Consult: Acute renal failure  Referring Physician: Reyne Dumas M.D. [facility])  HPI: 59 year old man of Hispanic origin with past medical history significant for type 2 diabetes mellitus without documented retinopathy for the past 19 years, hypertension for equal duration reported complications and what appears to be normal renal function at baseline (when seen 2 weeks ago in the emergency room for abdominal pain, creatinine was 1.1).  He presented today to the emergency room with hiccups, nausea, vomiting and some right lower quadrant pain for the past few days that seemed to worsen as the day progressed. 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 He reports that fluid intake is limited and several of his work mates have had similar complaints/ailments. He continued to take his lisinopril and metformin through this course. He reports one single dose of ibuprofen 2 days ago and no preceding NSAID use prior to that. He denies any prior history of acute renal failure, history of kidney stones or recurrent urinary tract infections. Denies rash , arthralgia or hematuria. Denies flank pain, fever or chills. Denies cough or sputum production.  In the emergency room-concern is raised with his elevated creatinine of 12 and hyperkalemia of 7 with metabolic acidosis.  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 He has 3 children with whom he has lost contact.  Past Medical History  Diagnosis Date  . Diabetes mellitus without complication   . Hypertension     Past Surgical History  Procedure Laterality Date  . Cholecystectomy    . Hernia repair      History reviewed. No pertinent family history.  Social History:  reports that he has quit smoking. He does not have any smokeless  tobacco history on file. He reports that he does not drink alcohol or use illicit drugs.  Allergies: No Known Allergies  Medications:  Scheduled: . sucralfate  1 g Oral TID WC & HS    Results for orders placed or performed during the hospital encounter of 06/22/15 (from the past 48 hour(s))  Lipase, blood     Status: None   Collection Time: 06/22/15 11:15 AM  Result Value Ref Range   Lipase 42 22 - 51 U/L  Comprehensive metabolic panel     Status: Abnormal   Collection Time: 06/22/15 11:15 AM  Result Value Ref Range   Sodium 132 (L) 135 - 145 mmol/L    Comment: REPEATED TO VERIFY   Potassium 7.0 (HH) 3.5 - 5.1 mmol/L    Comment: NO VISIBLE HEMOLYSIS REPEATED TO VERIFY CRITICAL RESULT CALLED TO, READ BACK BY AND VERIFIED WITH: BROWN,L. RN AT 7169 06/22/15 MULLINS,T    Chloride 88 (L) 101 - 111 mmol/L    Comment: REPEATED TO VERIFY   CO2 15 (L) 22 - 32 mmol/L    Comment: REPEATED TO VERIFY   Glucose, Bld 223 (H) 65 - 99 mg/dL   BUN 119 (H) 6 - 20 mg/dL    Comment: RESULTS CONFIRMED BY MANUAL DILUTION   Creatinine, Ser 11.96 (H) 0.61 - 1.24 mg/dL   Calcium 8.1 (L) 8.9 - 10.3 mg/dL   Total Protein 8.3 (H) 6.5 - 8.1 g/dL   Albumin 4.5 3.5 - 5.0 g/dL   AST 50 (H) 15 - 41 U/L   ALT 53 17 - 63 U/L  Alkaline Phosphatase 64 38 - 126 U/L   Total Bilirubin 1.2 0.3 - 1.2 mg/dL   GFR calc non Af Amer 4 (L) >60 mL/min   GFR calc Af Amer 5 (L) >60 mL/min    Comment: (NOTE) The eGFR has been calculated using the CKD EPI equation. This calculation has not been validated in all clinical situations. eGFR's persistently <60 mL/min signify possible Chronic Kidney Disease.    Anion gap 29 (H) 5 - 15    Comment: REPEATED TO VERIFY  CBC     Status: Abnormal   Collection Time: 06/22/15 11:15 AM  Result Value Ref Range   WBC 11.3 (H) 4.0 - 10.5 K/uL   RBC 4.75 4.22 - 5.81 MIL/uL   Hemoglobin 14.7 13.0 - 17.0 g/dL   HCT 42.1 39.0 - 52.0 %   MCV 88.6 78.0 - 100.0 fL   MCH 30.9 26.0 -  34.0 pg   MCHC 34.9 30.0 - 36.0 g/dL   RDW 13.4 11.5 - 15.5 %   Platelets 404 (H) 150 - 400 K/uL    Ct Abdomen Pelvis Wo Contrast  06/22/2015   CLINICAL DATA:  Abdominal pain nausea, vomiting, and diarrhea for several days  EXAM: CT ABDOMEN AND PELVIS WITHOUT CONTRAST  TECHNIQUE: Multidetector CT imaging of the abdomen and pelvis was performed following the standard protocol without IV contrast. Oral contrast was administered.  COMPARISON:  None.  FINDINGS: There is slight bibasilar lung atelectasis. Lung bases are otherwise are clear. There is a small amount of contrast in the distal esophagus, likely spontaneous gastroesophageal reflux.  Liver shows decreased attenuation, consistent with a degree of hepatic steatosis. No focal liver lesions are identified on this nonintravenous contrast enhanced study. Gallbladder is absent. There is no biliary duct dilatation.  Spleen, pancreas, and adrenals appear normal.  Kidneys bilaterally show no mass or hydronephrosis on either side. There are no renal or ureteral calculi on either side.  In the pelvis, the urinary bladder is midline with wall thickness within normal limits. There are multiple prostatic calculi present. There is a slight degree of fat in the left inguinal ring. There are sigmoid diverticula without diverticulitis. There is no pelvic mass or pelvic fluid collection. The appendix appears within normal limits.  There are scattered diverticula throughout the remainder the colon without diverticulitis. There is no bowel obstruction. No free air or portal venous air. There is no demonstrable ascites, adenopathy, or abscess in the abdomen or pelvis. There is no abdominal aortic aneurysm. There is degenerative change in the lumbar spine. No blastic or lytic bone lesions.  IMPRESSION: Scattered colonic diverticula without diverticulitis. No bowel obstruction. No abscess. Appendix appears normal.  No renal or ureteral calculus.  No hydronephrosis.  Multiple  prostatic calculi.  Contrast in the distal esophagus is felt to represent a degree of spontaneous gastroesophageal reflux.  There is a degree of hepatic steatosis.  Gallbladder absent.   Electronically Signed   By: Lowella Grip III M.D.   On: 06/22/2015 14:39    Review of Systems  Constitutional: Positive for malaise/fatigue. Negative for fever, chills and diaphoresis.  HENT: Negative.   Eyes: Negative.   Respiratory: Negative.   Cardiovascular: Negative.   Gastrointestinal: Positive for nausea, vomiting and abdominal pain.       Hiccups  Pain over right lower quadrant   Genitourinary: Negative for hematuria.       Decreased urinary output  Musculoskeletal: Positive for joint pain.       Pain over  right hip/right lower quadrant  Skin: Negative.   Neurological: Positive for dizziness and weakness. Negative for sensory change and focal weakness.  Endo/Heme/Allergies: Negative.    Blood pressure 92/49, pulse 78, temperature 98.4 F (36.9 C), temperature source Oral, resp. rate 15, SpO2 99 %. Physical Exam  Nursing note and vitals reviewed. Constitutional: He is oriented to person, place, and time. He appears well-developed and well-nourished. No distress.  HENT:  Head: Normocephalic and atraumatic.  Mouth/Throat: No oropharyngeal exudate.  Dry oral mucosa/oropharynx  Eyes: Conjunctivae and EOM are normal. Pupils are equal, round, and reactive to light. No scleral icterus.  Neck: Normal range of motion. Neck supple. No JVD present. No thyromegaly present.  Jugular veins flat  Cardiovascular: Normal rate, regular rhythm and normal heart sounds.   No murmur heard. Respiratory: Effort normal and breath sounds normal. He has no wheezes. He has no rales.  GI: Soft. Bowel sounds are normal. There is no tenderness. There is no rebound and no guarding.  No tenderness elicited on physical exam  Musculoskeletal: Normal range of motion. He exhibits no edema or tenderness.  Neurological:  He is alert and oriented to person, place, and time.  No asterixis  Skin: Skin is warm and dry. No rash noted. No erythema.  Psychiatric: He has a normal mood and affect.    Assessment/Plan: 1. Acute renal failure:  appears to be hemodynamically mediated from volume contraction/decreased oral intake in the face of ongoing ACE inhibitor use. CT scan of the abdomen does not show any hydronephrosis and he has not had any clear nephrotoxic exposure. He does have some recent preceding ciprofloxacin use that could be differential for acute interstitial nephritis. Urinalysis/urine electrolytes will be sent. Treatment at this time will be supported with aggressive intravenous fluids and monitoring of labs as will concomitantly treat his hyperkalemia/metabolic acidosis. He does not have any indications for dialysis at this time. 2. Anion gap metabolic acidosis: Likely from acute renal failure and probably lactic acidosis from recent worsening of renal function in the face of ongoing metformin use. Attempt management with intravenous isotonic sodium bicarbonate. 3. Hyperkalemia: Secondary to acute renal failure and ongoing ACE inhibitor use, treat acutely with insulin/sodium bicarbonate drip. Hold ACE inhibitor at this time. 4. Hypotension: This appears to be representative of his volume depletion,  begin aggressive intravenous fluid resuscitation 5. Hyponatremia: Secondary to acute renal failure and free water excretion defect/hypotonic fluid ingestion. Monitor with isotonic fluids and renal recovery.  Chermaine Schnyder K. 06/22/2015, 3:53 PM

## 2015-06-23 DIAGNOSIS — I10 Essential (primary) hypertension: Secondary | ICD-10-CM

## 2015-06-23 DIAGNOSIS — A047 Enterocolitis due to Clostridium difficile: Secondary | ICD-10-CM

## 2015-06-23 LAB — CBC
HEMATOCRIT: 33.6 % — AB (ref 39.0–52.0)
HEMOGLOBIN: 11.8 g/dL — AB (ref 13.0–17.0)
MCH: 30.6 pg (ref 26.0–34.0)
MCHC: 35.1 g/dL (ref 30.0–36.0)
MCV: 87 fL (ref 78.0–100.0)
Platelets: 299 10*3/uL (ref 150–400)
RBC: 3.86 MIL/uL — AB (ref 4.22–5.81)
RDW: 13.4 % (ref 11.5–15.5)
WBC: 7.9 10*3/uL (ref 4.0–10.5)

## 2015-06-23 LAB — COMPREHENSIVE METABOLIC PANEL
ALK PHOS: 45 U/L (ref 38–126)
ALT: 37 U/L (ref 17–63)
AST: 27 U/L (ref 15–41)
Albumin: 2.9 g/dL — ABNORMAL LOW (ref 3.5–5.0)
Anion gap: 19 — ABNORMAL HIGH (ref 5–15)
BILIRUBIN TOTAL: 0.9 mg/dL (ref 0.3–1.2)
BUN: 115 mg/dL — AB (ref 6–20)
CO2: 28 mmol/L (ref 22–32)
CREATININE: 7.27 mg/dL — AB (ref 0.61–1.24)
Calcium: 6 mg/dL — CL (ref 8.9–10.3)
Chloride: 91 mmol/L — ABNORMAL LOW (ref 101–111)
GFR calc Af Amer: 9 mL/min — ABNORMAL LOW (ref >60)
GFR, EST NON AFRICAN AMERICAN: 7 mL/min — AB (ref >60)
GLUCOSE: 100 mg/dL — AB (ref 65–99)
Potassium: 3.8 mmol/L (ref 3.5–5.1)
Sodium: 138 mmol/L (ref 135–145)
TOTAL PROTEIN: 5.9 g/dL — AB (ref 6.5–8.1)

## 2015-06-23 LAB — BASIC METABOLIC PANEL
Anion gap: 24 — ABNORMAL HIGH (ref 5–15)
BUN: 115 mg/dL — AB (ref 6–20)
CO2: 22 mmol/L (ref 22–32)
CREATININE: 9.6 mg/dL — AB (ref 0.61–1.24)
Calcium: 6.2 mg/dL — CL (ref 8.9–10.3)
Chloride: 91 mmol/L — ABNORMAL LOW (ref 101–111)
GFR calc Af Amer: 6 mL/min — ABNORMAL LOW (ref >60)
GFR, EST NON AFRICAN AMERICAN: 5 mL/min — AB (ref >60)
GLUCOSE: 107 mg/dL — AB (ref 65–99)
Potassium: 3.8 mmol/L (ref 3.5–5.1)
SODIUM: 137 mmol/L (ref 135–145)

## 2015-06-23 LAB — PHOSPHORUS
PHOSPHORUS: 4.6 mg/dL (ref 2.5–4.6)
Phosphorus: 4 mg/dL (ref 2.5–4.6)

## 2015-06-23 LAB — GLUCOSE, CAPILLARY
GLUCOSE-CAPILLARY: 75 mg/dL (ref 65–99)
GLUCOSE-CAPILLARY: 94 mg/dL (ref 65–99)
GLUCOSE-CAPILLARY: 255 mg/dL — AB (ref 65–99)
GLUCOSE-CAPILLARY: 182 mg/dL — AB (ref 65–99)
Glucose-Capillary: 117 mg/dL — ABNORMAL HIGH (ref 65–99)
Glucose-Capillary: 132 mg/dL — ABNORMAL HIGH (ref 65–99)
Glucose-Capillary: 190 mg/dL — ABNORMAL HIGH (ref 65–99)
Glucose-Capillary: 68 mg/dL (ref 65–99)
Glucose-Capillary: 81 mg/dL (ref 65–99)
Glucose-Capillary: 93 mg/dL (ref 65–99)
Glucose-Capillary: 98 mg/dL (ref 65–99)

## 2015-06-23 LAB — TROPONIN I
Troponin I: <0.03 ng/mL (ref <0.031)
Troponin I: <0.03 ng/mL (ref <0.031)
Troponin I: <0.03 ng/mL (ref <0.031)

## 2015-06-23 MED ORDER — GLUCOSE 40 % PO GEL
ORAL | Status: AC
  Filled 2015-06-23: qty 1

## 2015-06-23 MED ORDER — DEXTROSE 50 % IV SOLN
25.0000 mL | Freq: Once | INTRAVENOUS | Status: AC
  Administered 2015-06-23: 25 mL via INTRAVENOUS

## 2015-06-23 MED ORDER — INSULIN ASPART 100 UNIT/ML ~~LOC~~ SOLN
0.0000 [IU] | Freq: Three times a day (TID) | SUBCUTANEOUS | Status: DC
  Administered 2015-06-23: 2 [IU] via SUBCUTANEOUS
  Administered 2015-06-23: 5 [IU] via SUBCUTANEOUS
  Administered 2015-06-24 (×2): 2 [IU] via SUBCUTANEOUS
  Administered 2015-06-24: 3 [IU] via SUBCUTANEOUS
  Administered 2015-06-25: 1 [IU] via SUBCUTANEOUS
  Administered 2015-06-25 – 2015-06-26 (×3): 2 [IU] via SUBCUTANEOUS

## 2015-06-23 MED ORDER — DEXTROSE 50 % IV SOLN
INTRAVENOUS | Status: AC
  Filled 2015-06-23: qty 50

## 2015-06-23 MED ORDER — GLUCERNA SHAKE PO LIQD
237.0000 mL | Freq: Three times a day (TID) | ORAL | Status: DC
  Administered 2015-06-23 – 2015-06-24 (×3): 237 mL via ORAL

## 2015-06-23 MED ORDER — CALCIUM CARBONATE 1250 MG/5ML PO SUSP
500.0000 mg | Freq: Three times a day (TID) | ORAL | Status: AC | PRN
  Administered 2015-06-24: 500 mg via ORAL
  Filled 2015-06-23 (×2): qty 5

## 2015-06-23 MED ORDER — CALCIUM CARBONATE 1250 MG/5ML PO SUSP
500.0000 mg | Freq: Three times a day (TID) | ORAL | Status: AC | PRN
  Administered 2015-06-23: 500 mg via ORAL
  Filled 2015-06-23: qty 5

## 2015-06-23 NOTE — Progress Notes (Signed)
Hypoglycemic Event  CBG: 68  Treatment: D50 IV 25 mL  Symptoms: None  Follow-up CBG: Time:0425 CBG Result:132  Possible Reasons for Event: Unknown  Comments/MD notified:Kyazimova, PA    Charm Barges, Donnelly Stager  Remember to initiate Hypoglycemia Order Set & complete

## 2015-06-23 NOTE — Progress Notes (Signed)
Subjective:   Feeling well, diarrhea resolved. Reports good po intake, still having hiccups.  Objective Filed Vitals:   06/22/15 1944 06/22/15 2354 06/23/15 0432 06/23/15 1100  BP: 119/61 98/61 101/57   Pulse: 84 82 76   Temp: 97.9 F (36.6 C) 97.7 F (36.5 C) 97.6 F (36.4 C)   TempSrc: Oral Oral Oral   Resp: Height:     (1.651 m)  Weight:      SpO2: 100% 98% 100%    Physical Exam General: alert and oriented, sitting in chair. No acute distress.  Heart:  RRR Lungs: CTA, unlabored  Abdomen: soft, nontender +BS Extremities: no edema  Assessment/Plan:  1. Acute renal failure: appears to be hemodynamically mediated from volume contraction/decreased oral intake in the face of ongoing ACE inhibitor use. CT scan of the abdomen does not show any hydronephrosis and he has not had any clear nephrotoxic exposure. He does have some recent preceding ciprofloxacin use that could be differential for acute interstitial nephritis Creat improving- now 7.27 (from 11.7 on admission) Urine oupt not measured- reports urinated 3 x today- encouraged to save for measuring I&Os- check phos 2. Hyperkalemia: Secondary to acute renal failure and ongoing ACE inhibitor use- ACE now on hold. K+ 3.8 3. Hypotension: 101/57  volume depletion,aggressive intravenous fluid resuscitation- BP meds on hold 4. Hyponatremia: Improved with isotonic fluids - now 138- cont to monitor 5. cdiff- oral vanc.  6. DM- per primary. Metformin on hold   Jetty Duhamel, NP [employer] [phone] 06/23/2015,2:00 PM  LOS: 1 day   Renal Attending: I agree with the note as articulated above. Appears to be recovering fct. Marchella Hibbard C   Additional Objective Labs: Basic Metabolic Panel:  Recent Labs Lab 06/22/15 1657 06/23/15 0053 06/23/15 0546  NA 135 137 138  K 4.7 3.8 3.8  CL 94* 91* 91*  CO2 15* 22 28  GLUCOSE 64* 107* 100*  BUN 123* 115* 115*  CREATININE 11.76* 9.60* 7.27*   CALCIUM 7.0* 6.2* 6.0*   Liver Function Tests:  Recent Labs Lab 06/22/15 1115 06/23/15 0546  AST 50* 27  ALT 53 37  ALKPHOS 64 45  BILITOT 1.2 0.9  PROT 8.3* 5.9*  ALBUMIN 4.5 2.9*    Recent Labs Lab 06/22/15 1115  LIPASE 42   CBC:  Recent Labs Lab 06/22/15 1115 06/22/15 1657 06/23/15 0546  WBC 11.3* 12.0* 7.9  NEUTROABS  --  8.4*  --   HGB 14.7 13.0 11.8*  HCT 42.1 37.6* 33.6*  MCV 88.6 89.1 87.0  PLT 404* 375 299   Blood Culture No results found for: SDES, SPECREQUEST, CULT, REPTSTATUS  Cardiac Enzymes:  Recent Labs Lab 06/22/15 1657 06/23/15 0546  CKTOTAL 1850*  --   CKMB 40.9*  --   TROPONINI <0.03 <0.03   CBG:  Recent Labs Lab 06/23/15 0425 06/23/15 0559 06/23/15 0654 06/23/15 0750 06/23/15 1158  GLUCAP 132* 94 93 81 255*   Iron Studies: No results for input(s): IRON, TIBC, TRANSFERRIN, FERRITIN in the last 72 hours. @ Studies/Results: Ct Abdomen Pelvis Wo Contrast  06/22/2015   CLINICAL DATA:  Abdominal pain nausea, vomiting, and diarrhea for several days  EXAM: CT ABDOMEN AND PELVIS WITHOUT CONTRAST  TECHNIQUE: Multidetector CT imaging of the abdomen and pelvis was performed following the standard protocol without IV contrast. Oral contrast was administered.  COMPARISON:  None.  FINDINGS: There is slight bibasilar lung atelectasis. Lung bases are otherwise are clear. There is  a small amount of contrast in the distal esophagus, likely spontaneous gastroesophageal reflux.  Liver shows decreased attenuation, consistent with a degree of hepatic steatosis. No focal liver lesions are identified on this nonintravenous contrast enhanced study. Gallbladder is absent. There is no biliary duct dilatation.  Spleen, pancreas, and adrenals appear normal.  Kidneys bilaterally show no mass or hydronephrosis on either side. There are no renal or ureteral calculi on either side.  In the pelvis, the urinary bladder is midline with wall thickness within  normal limits. There are multiple prostatic calculi present. There is a slight degree of fat in the left inguinal ring. There are sigmoid diverticula without diverticulitis. There is no pelvic mass or pelvic fluid collection. The appendix appears within normal limits.  There are scattered diverticula throughout the remainder the colon without diverticulitis. There is no bowel obstruction. No free air or portal venous air. There is no demonstrable ascites, adenopathy, or abscess in the abdomen or pelvis. There is no abdominal aortic aneurysm. There is degenerative change in the lumbar spine. No blastic or lytic bone lesions.  IMPRESSION: Scattered colonic diverticula without diverticulitis. No bowel obstruction. No abscess. Appendix appears normal.  No renal or ureteral calculus.  No hydronephrosis.  Multiple prostatic calculi.  Contrast in the distal esophagus is felt to represent a degree of spontaneous gastroesophageal reflux.  There is a degree of hepatic steatosis.  Gallbladder absent.   Electronically Signed   By: Bretta Bang III M.D.   On: 06/22/2015 14:39   X-ray Chest Pa And Lateral  06/22/2015   CLINICAL DATA:  Generalized abdominal pain and chest pain for 1 week.  EXAM: CHEST  2 VIEW  COMPARISON:  None.  FINDINGS: The cardiac silhouette, mediastinal and hilar contours are within normal limits. The lungs are clear. No pleural effusion and or pneumothorax. The bony thorax is intact.  IMPRESSION: No acute cardiopulmonary findings.   Electronically Signed   By: Rudie Meyer M.D.   On: 06/22/2015 16:38   Medications: .  sodium bicarbonate 150 mEq in sterile water 1000 mL infusion 200 mL/hr at 06/23/15 1217   . feeding supplement (GLUCERNA SHAKE)  237 mL Oral TID BM  . heparin  5,000 Units Subcutaneous 3 times per day  . insulin aspart  0-9 Units Subcutaneous TID WC  . sodium chloride  3 mL Intravenous Q12H  . sucralfate  1 g Oral TID WC & HS  . vancomycin  250 mg Oral 4 times per day

## 2015-06-23 NOTE — Progress Notes (Signed)
CRITICAL VALUE ALERT  Critical value received:  Ca 6.2  Date of notification:  06/23/15  Time of notification:  0152  Critical value read back:Yes.    Nurse who received alert:  Allayne Stack, RN   MD notified (1st page):  [place], [place]    Time of first page:  0153  MD notified (2nd page):  Time of second page:  Responding MD:  Si Raider, [place]  Time MD responded:  [phone]

## 2015-06-23 NOTE — Progress Notes (Signed)
CRITICAL VALUE ALERT  Critical value received:  Ca 6.0  Date of notification:  06/23/15  Time of notification:  0644  Critical value read back:Yes.    Nurse who received alert:  Evalynn Hankins  MD notified (1st page):  [place], [place]  Time of first page:  0645  MD notified (2nd page):  Time of second page:  Responding MD:  Si Raider, [place]  Time MD responded:  [phone]

## 2015-06-23 NOTE — Progress Notes (Signed)
Barnetta Chapel, NP paged to clarify IV fluids for patient. Barnetta Chapel stated she will talk with MD and change fluids accordingly if necessary.

## 2015-06-23 NOTE — Progress Notes (Signed)
[facility] PROGRESS NOTE  Matthew Kerr AVW:[id number] DOB: [birth year] DOA: 06/22/2015 PCP: PROVIDER NOT IN SYSTEM  Assessment/Plan: 59 y/o male with PMH of HTN, DM presented with dizziness, after nausea, vomiting and diarrhea for 3-4 days. He recently was prescribed ciprofloxacin oral as outpatient  -Admitted with AKI with creatinine at 12, and  hyperkalemia. Found to have positive for C diff   1. AKI likely prerenal/hypovolemic/dehydration due to nausea, vomiting diarrhea on top of nephrotoxins (ACE, metformin). No hydronephrosis on abd CT -renal function is improving with IVF. Currently on IV bicarb per nephrology. Cont monitor I/O, uribe output, labs. appreciate nephrology input.  2. Metabolic acidosis, hyperkalemia in the setting of AKI. Hyperkalemia ->resolved. hypokalemia in the setting of AKI, and IV bicarb. May need to switch to NS. Defer to nephrology.  Cont as above  3. DM. Check ha1c. Cont ISS. Discontinue metformin due renal issues.  4. HTN. Currently BP is soft. Discontinue lisinopril due to AKI.  5. C diff. Started on PO vanc. Patient still reports mild diarrhea     Code Status: Full  Family Communication: d/w patient, RN (indicate person spoken with, relationship, and if by phone, the number) Disposition Plan: home pend clinical improvement    Consultants:  Nephrology   Procedures:  none  Antibiotics  vanc 8/18>>>>>   (indicate start date, and stop date if known)  HPI/Subjective: Alert, reports diarrhea   Objective: Filed Vitals:   06/23/15 0432  BP: 101/57  Pulse: 76  Temp: 97.6 F (36.4 C)  Resp: 15    Intake/Output Summary (Last 24 hours) at 06/23/15 0945 Last data filed at 06/23/15 0800  Gross per 24 hour  Intake   2668 ml  Output    350 ml  Net   2318 ml   Filed Weights   06/22/15 1941  Weight: 72.3 kg (159 lb 6.3 oz)    Exam:   General:  No distress   Cardiovascular: s1,s2 rrr  Respiratory: CTA BL  Abdomen: soft,  nt,nd   Musculoskeletal: no leg edema   Data Reviewed: Basic Metabolic Panel:  Recent Labs Lab 06/22/15 1115 06/22/15 1657 06/23/15 0053 06/23/15 0546  NA 132* 135 137 138  K 7.0* 4.7 3.8 3.8  CL 88* 94* 91* 91*  CO2 15* 15* 22 28  GLUCOSE 223* 64* 107* 100*  BUN 119* 123* 115* 115*  CREATININE 11.96* 11.76* 9.60* 7.27*  CALCIUM 8.1* 7.0* 6.2* 6.0*  MG  --  1.6*  --   --    Liver Function Tests:  Recent Labs Lab 06/22/15 1115 06/23/15 0546  AST 50* 27  ALT 53 37  ALKPHOS 64 45  BILITOT 1.2 0.9  PROT 8.3* 5.9*  ALBUMIN 4.5 2.9*    Recent Labs Lab 06/22/15 1115  LIPASE 42   No results for input(s): AMMONIA in the last 168 hours. CBC:  Recent Labs Lab 06/22/15 1115 06/22/15 1657 06/23/15 0546  WBC 11.3* 12.0* 7.9  NEUTROABS  --  8.4*  --   HGB 14.7 13.0 11.8*  HCT 42.1 37.6* 33.6*  MCV 88.6 89.1 87.0  PLT 404* 375 299   Cardiac Enzymes:  Recent Labs Lab 06/22/15 1657 06/23/15 0546  CKTOTAL 1850*  --   CKMB 40.9*  --   TROPONINI <0.03 <0.03   BNP (last 3 results) No results for input(s): BNP in the last 8760 hours.  ProBNP (last 3 results) No results for input(s): PROBNP in the last 8760 hours.  CBG:  Recent Labs Lab 06/23/15  1610 06/23/15 0425 06/23/15 0559 06/23/15 0654 06/23/15 0750  GLUCAP 68 132* 94 93 81    Recent Results (from the past 240 hour(s))  C difficile quick scan w PCR reflex     Status: Abnormal   Collection Time: 06/22/15  5:36 PM  Result Value Ref Range Status   C Diff antigen POSITIVE (A) NEGATIVE Final   C Diff toxin POSITIVE (A) NEGATIVE Final   C Diff interpretation Positive for toxigenic C. difficile  Final    Comment: CRITICAL RESULT CALLED TO, READ BACK BY AND VERIFIED WITH: Bernie Covey [id number] @ 1909 BY J SCOTTON      Studies: Ct Abdomen Pelvis Wo Contrast  06/22/2015   CLINICAL DATA:  Abdominal pain nausea, vomiting, and diarrhea for several days  EXAM: CT ABDOMEN AND PELVIS WITHOUT  CONTRAST  TECHNIQUE: Multidetector CT imaging of the abdomen and pelvis was performed following the standard protocol without IV contrast. Oral contrast was administered.  COMPARISON:  None.  FINDINGS: There is slight bibasilar lung atelectasis. Lung bases are otherwise are clear. There is a small amount of contrast in the distal esophagus, likely spontaneous gastroesophageal reflux.  Liver shows decreased attenuation, consistent with a degree of hepatic steatosis. No focal liver lesions are identified on this nonintravenous contrast enhanced study. Gallbladder is absent. There is no biliary duct dilatation.  Spleen, pancreas, and adrenals appear normal.  Kidneys bilaterally show no mass or hydronephrosis on either side. There are no renal or ureteral calculi on either side.  In the pelvis, the urinary bladder is midline with wall thickness within normal limits. There are multiple prostatic calculi present. There is a slight degree of fat in the left inguinal ring. There are sigmoid diverticula without diverticulitis. There is no pelvic mass or pelvic fluid collection. The appendix appears within normal limits.  There are scattered diverticula throughout the remainder the colon without diverticulitis. There is no bowel obstruction. No free air or portal venous air. There is no demonstrable ascites, adenopathy, or abscess in the abdomen or pelvis. There is no abdominal aortic aneurysm. There is degenerative change in the lumbar spine. No blastic or lytic bone lesions.  IMPRESSION: Scattered colonic diverticula without diverticulitis. No bowel obstruction. No abscess. Appendix appears normal.  No renal or ureteral calculus.  No hydronephrosis.  Multiple prostatic calculi.  Contrast in the distal esophagus is felt to represent a degree of spontaneous gastroesophageal reflux.  There is a degree of hepatic steatosis.  Gallbladder absent.   Electronically Signed   By: Bretta Bang III M.D.   On: 06/22/2015 14:39    X-ray Chest Pa And Lateral  06/22/2015   CLINICAL DATA:  Generalized abdominal pain and chest pain for 1 week.  EXAM: CHEST  2 VIEW  COMPARISON:  None.  FINDINGS: The cardiac silhouette, mediastinal and hilar contours are within normal limits. The lungs are clear. No pleural effusion and or pneumothorax. The bony thorax is intact.  IMPRESSION: No acute cardiopulmonary findings.   Electronically Signed   By: Rudie Meyer M.D.   On: 06/22/2015 16:38    Scheduled Meds: . heparin  5,000 Units Subcutaneous 3 times per day  . sodium chloride  3 mL Intravenous Q12H  . sucralfate  1 g Oral TID WC & HS  . vancomycin  250 mg Oral 4 times per day   Continuous Infusions: . insulin (NOVOLIN-R) infusion    .  sodium bicarbonate 150 mEq in sterile water 1000 mL infusion 200 mL/hr at 06/23/15 [id number]  Principal Problem:   Acute kidney injury Active Problems:   Hypertension, essential, benign   Diabetes type 2, uncontrolled    Time spent: >35 minutes     Esperanza Sheets  [facility] Pager [phone]. If 7PM-7AM, please contact night-coverage at [email or web address], password [facility] 06/23/2015, 9:45 AM  LOS: 1 day

## 2015-06-23 NOTE — Progress Notes (Signed)
Dr. York Spaniel notified of patients trending CBGs lower than 100 and that patient has not been on insulin drip while at [place] [place].   0800: Dr. York Spaniel telephone order for carb modified diet and to check blood sugar ACHS.

## 2015-06-23 NOTE — Progress Notes (Signed)
On call Kyazimova, PA made aware of pt not started on insulin drip prior to transfer from [facility] ED to 5W35 d/t low CBGs. Pt continues to have low CBGs since being admitted to floor. Sodium bicarb @ 200 mL/hr infusing. Will continue to monitor CBGs q1 as ordered.

## 2015-06-23 NOTE — Progress Notes (Signed)
Initial Nutrition Assessment  DOCUMENTATION CODES:   Non-severe (moderate) malnutrition in context of acute illness/injury  INTERVENTION:   Glucerna Shake po TID, each supplement provides 220 kcal and 10 grams of protein  NUTRITION DIAGNOSIS:   Inadequate oral intake related to altered GI function as evidenced by percent weight loss, per patient/family report (5% weight loss within the past few months).  GOAL:   Patient will meet greater than or equal to 90% of their needs  MONITOR:   PO intake, Supplement acceptance, Labs, Weight trends  REASON FOR ASSESSMENT:   Malnutrition Screening Tool    ASSESSMENT:   59 y/o male with PMH of HTN, DM presented with dizziness, after nausea, vomiting and diarrhea for 3-4 days. He recently was prescribed ciprofloxacin oral as outpatient. C. diff positive.  Patient reports usual weight 167 lbs, 5% weight loss within the past month. He has been eating poorly over the past few weeks, consuming ~half of his usual intake. C/o no appetite. He is  tall, updated height in the computer. Ate 75% of salad, crackers, and fruit this AM. He would like to try a PO supplement since he has been eating poorly for the past few weeks.   Diet Order:  Diet Carb Modified Fluid consistency:: Thin; Room service appropriate?: Yes  Skin:  Reviewed, no issues  Last BM:  8/19  Height:   Ht Readings from Last 1 Encounters:  06/23/15  (1.651 m)    Weight:   Wt Readings from Last 1 Encounters:  06/22/15 159 lb 6.3 oz (72.3 kg)    Ideal Body Weight:  61.8 kg  BMI:  Body mass index is 26.52 kg/(m^2).  Estimated Nutritional Needs:   Kcal:  1800-2000  Protein:  90-110 gm  Fluid:  2 L  EDUCATION NEEDS:   Education needs addressed    Joaquin Courts, RD, LDN, CNSC Pager [phone] After Hours Pager [phone]

## 2015-06-24 DIAGNOSIS — E440 Moderate protein-calorie malnutrition: Secondary | ICD-10-CM | POA: Diagnosis present

## 2015-06-24 LAB — BASIC METABOLIC PANEL
Anion gap: 11 (ref 5–15)
BUN: 56 mg/dL — ABNORMAL HIGH (ref 6–20)
CHLORIDE: 83 mmol/L — AB (ref 101–111)
CO2: 45 mmol/L — ABNORMAL HIGH (ref 22–32)
Calcium: 6.2 mg/dL — CL (ref 8.9–10.3)
Creatinine, Ser: 2.3 mg/dL — ABNORMAL HIGH (ref 0.61–1.24)
GFR calc non Af Amer: 30 mL/min — ABNORMAL LOW (ref >60)
GFR, EST AFRICAN AMERICAN: 35 mL/min — AB (ref >60)
Glucose, Bld: 138 mg/dL — ABNORMAL HIGH (ref 65–99)
POTASSIUM: 3 mmol/L — AB (ref 3.5–5.1)
SODIUM: 139 mmol/L (ref 135–145)

## 2015-06-24 LAB — TROPONIN I: Troponin I: <0.03 ng/mL (ref <0.031)

## 2015-06-24 LAB — GLUCOSE, CAPILLARY
GLUCOSE-CAPILLARY: 213 mg/dL — AB (ref 65–99)
GLUCOSE-CAPILLARY: 194 mg/dL — AB (ref 65–99)
Glucose-Capillary: 165 mg/dL — ABNORMAL HIGH (ref 65–99)
Glucose-Capillary: 177 mg/dL — ABNORMAL HIGH (ref 65–99)

## 2015-06-24 LAB — HEMOGLOBIN A1C
HEMOGLOBIN A1C: 9.3 % — AB (ref 4.8–5.6)
MEAN PLASMA GLUCOSE: 220 mg/dL

## 2015-06-24 LAB — PHOSPHORUS: Phosphorus: 2.9 mg/dL (ref 2.5–4.6)

## 2015-06-24 LAB — MAGNESIUM: Magnesium: 1.4 mg/dL — ABNORMAL LOW (ref 1.7–2.4)

## 2015-06-24 MED ORDER — MAGNESIUM SULFATE 2 GM/50ML IV SOLN
2.0000 g | Freq: Once | INTRAVENOUS | Status: AC
  Administered 2015-06-24: 2 g via INTRAVENOUS
  Filled 2015-06-24: qty 50

## 2015-06-24 MED ORDER — POTASSIUM CHLORIDE CRYS ER 20 MEQ PO TBCR
20.0000 meq | EXTENDED_RELEASE_TABLET | Freq: Once | ORAL | Status: AC
  Administered 2015-06-24: 20 meq via ORAL
  Filled 2015-06-24: qty 1

## 2015-06-24 MED ORDER — SODIUM CHLORIDE 0.9 % IV SOLN
INTRAVENOUS | Status: DC
  Administered 2015-06-24 – 2015-06-25 (×2): via INTRAVENOUS

## 2015-06-24 MED ORDER — CHLORPROMAZINE HCL 10 MG PO TABS
10.0000 mg | ORAL_TABLET | Freq: Three times a day (TID) | ORAL | Status: DC | PRN
  Administered 2015-06-24 – 2015-06-25 (×2): 10 mg via ORAL
  Filled 2015-06-24 (×4): qty 1

## 2015-06-24 MED ORDER — VANCOMYCIN 50 MG/ML ORAL SOLUTION
125.0000 mg | Freq: Four times a day (QID) | ORAL | Status: DC
  Administered 2015-06-24 – 2015-06-26 (×7): 125 mg via ORAL
  Filled 2015-06-24 (×11): qty 2.5

## 2015-06-24 MED ORDER — CALCIUM GLUCONATE 10 % IV SOLN
1.0000 g | Freq: Once | INTRAVENOUS | Status: AC
  Administered 2015-06-24: 1 g via INTRAVENOUS
  Filled 2015-06-24: qty 10

## 2015-06-24 NOTE — Progress Notes (Signed)
CRITICAL VALUE ALERT  Critical value received:  Calcium 6.2  Date of notification:  06/24/15  Time of notification:  0525  Critical value read back:Yes.    Nurse who received alert:  Nelda Marseille, RN  MD notified (1st page):  Dr. Criselda Peaches  Time of first page: 0527  MD notified (2nd page):  Time of second page:  Responding MD:  Dr. Criselda Peaches, stated she would enter order for calcium  Time MD responded:  [phone]

## 2015-06-24 NOTE — Progress Notes (Signed)
Renal fct improved. We will sign off. Penne Rosenstock C

## 2015-06-24 NOTE — Progress Notes (Signed)
[facility] PROGRESS NOTE  Matthew Kerr:[id number] DOB: 18-Oct-1956 DOA: 06/22/2015 PCP: PROVIDER NOT IN SYSTEM  Assessment/Plan: 59 y/o male with PMH of HTN, DM presented with dizziness, after nausea, vomiting and diarrhea for 3-4 days. He recently was prescribed ciprofloxacin oral as outpatient  -Admitted with AKI with creatinine at 12, and  hyperkalemia. Found to have positive for C diff   1. AKI:  likely prerenal/hypovolemic/dehydration due to nausea, vomiting diarrhea on top of nephrotoxins (ACE, metformin). No hydronephrosis on abd CT -Renal function is improving with IVF.  -Change IV fluids to NS, decrease rate to 125, diarrhea improving.  -Renal function significantly improved. Cr has decreased to 2.3 -Replace magnesium and potasium.   2. Metabolic acidosis, hyperkalemia in the setting of AKI. Hyperkalemia ->resolved. Metabolic acidosis resolved. I have change IV fluids to NS.  Replete Mg and Potasium.   3. DM. Check HbA1c at 9.3. Need better blood sugar controlled. . Cont ISS. Discontinue metformin due renal issues.   4. HTN. Currently BP is soft. Continue to hold  lisinopril due to AKI.    5. C diff. Started on PO vanc. Diarrhea improving. Care management consulted. Need to make sure patient will be able to afford medications.   6. Hypocalcemia: correction by albumin: corrected calcium 7.1. He received IV supplement. Repeat in AM , if still low will replace orally.   7. Hiccups: start chlorpromazine PRN.   Code Status: Full  Family Communication: d/w patient.  Disposition Plan: home pend clinical improvement    Consultants:  Nephrology   Procedures:  none  Antibiotics  vanc 8/18>>>>>   (indicate start date, and stop date if known)  HPI/Subjective: He is feeling better. Diarrhea improving. Had 3 episodes of watery diarrhea yesterday.  He is here in [employer] working. He lives in [place].    Objective: Filed Vitals:   06/24/15 0607  BP:    Pulse: 60  Temp:   Resp:     Intake/Output Summary (Last 24 hours) at 06/24/15 0909 Last data filed at 06/24/15 8119  Gross per 24 hour  Intake 5133.33 ml  Output    500 ml  Net 4633.33 ml   Filed Weights   06/22/15 1941  Weight: 72.3 kg (159 lb 6.3 oz)    Exam:   General:  No distress   Cardiovascular: s1,s2 rrr  Respiratory: CTA BL  Abdomen: soft, nt,nd   Musculoskeletal: no leg edema   Data Reviewed: Basic Metabolic Panel:  Recent Labs Lab 06/22/15 1115 06/22/15 1657 06/23/15 0053 06/23/15 0546 06/23/15 1410 06/23/15 2011 06/24/15 0430  NA 132* 135 137 138  --   --  139  K 7.0* 4.7 3.8 3.8  --   --  3.0*  CL 88* 94* 91* 91*  --   --  83*  CO2 15* 15* 22 28  --   --  45*  GLUCOSE 223* 64* 107* 100*  --   --  138*  BUN 119* 123* 115* 115*  --   --  56*  CREATININE 11.96* 11.76* 9.60* 7.27*  --   --  2.30*  CALCIUM 8.1* 7.0* 6.2* 6.0*  --   --  6.2*  MG  --  1.6*  --   --   --   --  1.4*  PHOS  --   --   --   --  4.6 4.0 2.9   Liver Function Tests:  Recent Labs Lab 06/22/15 1115 06/23/15 0546  AST 50* 27  ALT 53  37  ALKPHOS 64 45  BILITOT 1.2 0.9  PROT 8.3* 5.9*  ALBUMIN 4.5 2.9*    Recent Labs Lab 06/22/15 1115  LIPASE 42   No results for input(s): AMMONIA in the last 168 hours. CBC:  Recent Labs Lab 06/22/15 1115 06/22/15 1657 06/23/15 0546  WBC 11.3* 12.0* 7.9  NEUTROABS  --  8.4*  --   HGB 14.7 13.0 11.8*  HCT 42.1 37.6* 33.6*  MCV 88.6 89.1 87.0  PLT 404* 375 299   Cardiac Enzymes:  Recent Labs Lab 06/22/15 1657 06/23/15 0546 06/23/15 1410 06/23/15 2011 06/23/15 2231 06/24/15 0430  CKTOTAL 1850*  --   --   --   --   --   CKMB 40.9*  --   --   --   --   --   TROPONINI <0.03 <0.03 <0.03 <0.03 <0.03 <0.03   BNP (last 3 results) No results for input(s): BNP in the last 8760 hours.  ProBNP (last 3 results) No results for input(s): PROBNP in the last 8760 hours.  CBG:  Recent Labs Lab 06/23/15 0750  06/23/15 1158 06/23/15 1738 06/23/15 2229 06/24/15 0745  GLUCAP 81 255* 182* 190* 165*    Recent Results (from the past 240 hour(s))  C difficile quick scan w PCR reflex     Status: Abnormal   Collection Time: 06/22/15  5:36 PM  Result Value Ref Range Status   C Diff antigen POSITIVE (A) NEGATIVE Final   C Diff toxin POSITIVE (A) NEGATIVE Final   C Diff interpretation Positive for toxigenic C. difficile  Final    Comment: CRITICAL RESULT CALLED TO, READ BACK BY AND VERIFIED WITH: Bernie Covey [id number] @ 1909 BY J SCOTTON      Studies: Ct Abdomen Pelvis Wo Contrast  06/22/2015   CLINICAL DATA:  Abdominal pain nausea, vomiting, and diarrhea for several days  EXAM: CT ABDOMEN AND PELVIS WITHOUT CONTRAST  TECHNIQUE: Multidetector CT imaging of the abdomen and pelvis was performed following the standard protocol without IV contrast. Oral contrast was administered.  COMPARISON:  None.  FINDINGS: There is slight bibasilar lung atelectasis. Lung bases are otherwise are clear. There is a small amount of contrast in the distal esophagus, likely spontaneous gastroesophageal reflux.  Liver shows decreased attenuation, consistent with a degree of hepatic steatosis. No focal liver lesions are identified on this nonintravenous contrast enhanced study. Gallbladder is absent. There is no biliary duct dilatation.  Spleen, pancreas, and adrenals appear normal.  Kidneys bilaterally show no mass or hydronephrosis on either side. There are no renal or ureteral calculi on either side.  In the pelvis, the urinary bladder is midline with wall thickness within normal limits. There are multiple prostatic calculi present. There is a slight degree of fat in the left inguinal ring. There are sigmoid diverticula without diverticulitis. There is no pelvic mass or pelvic fluid collection. The appendix appears within normal limits.  There are scattered diverticula throughout the remainder the colon without diverticulitis.  There is no bowel obstruction. No free air or portal venous air. There is no demonstrable ascites, adenopathy, or abscess in the abdomen or pelvis. There is no abdominal aortic aneurysm. There is degenerative change in the lumbar spine. No blastic or lytic bone lesions.  IMPRESSION: Scattered colonic diverticula without diverticulitis. No bowel obstruction. No abscess. Appendix appears normal.  No renal or ureteral calculus.  No hydronephrosis.  Multiple prostatic calculi.  Contrast in the distal esophagus is felt to represent a degree of spontaneous  gastroesophageal reflux.  There is a degree of hepatic steatosis.  Gallbladder absent.   Electronically Signed   By: Bretta Bang III M.D.   On: 06/22/2015 14:39   X-ray Chest Pa And Lateral  06/22/2015   CLINICAL DATA:  Generalized abdominal pain and chest pain for 1 week.  EXAM: CHEST  2 VIEW  COMPARISON:  None.  FINDINGS: The cardiac silhouette, mediastinal and hilar contours are within normal limits. The lungs are clear. No pleural effusion and or pneumothorax. The bony thorax is intact.  IMPRESSION: No acute cardiopulmonary findings.   Electronically Signed   By: Rudie Meyer M.D.   On: 06/22/2015 16:38    Scheduled Meds: . feeding supplement (GLUCERNA SHAKE)  237 mL Oral TID BM  . heparin  5,000 Units Subcutaneous 3 times per day  . insulin aspart  0-9 Units Subcutaneous TID WC  . magnesium sulfate 1 - 4 g bolus IVPB  2 g Intravenous Once  . sodium chloride  3 mL Intravenous Q12H  . sucralfate  1 g Oral TID WC & HS  . vancomycin  250 mg Oral 4 times per day   Continuous Infusions: . sodium chloride 150 mL/hr at 06/24/15 9604    Principal Problem:   Acute kidney injury Active Problems:   Hypertension, essential, benign   Diabetes type 2, uncontrolled   Malnutrition of moderate degree    Time spent: >35 minutes     Zamauri Nez A  [facility] Pager [phone]. If 7PM-7AM, please contact night-coverage at  [email or web address], password [place] 06/24/2015, 9:09 AM  LOS: 2 days

## 2015-06-25 LAB — BASIC METABOLIC PANEL
Anion gap: 9 (ref 5–15)
BUN: 31 mg/dL — ABNORMAL HIGH (ref 6–20)
CALCIUM: 7.2 mg/dL — AB (ref 8.9–10.3)
CO2: 32 mmol/L (ref 22–32)
CREATININE: 1.23 mg/dL (ref 0.61–1.24)
Chloride: 98 mmol/L — ABNORMAL LOW (ref 101–111)
GLUCOSE: 148 mg/dL — AB (ref 65–99)
Potassium: 3.5 mmol/L (ref 3.5–5.1)
Sodium: 139 mmol/L (ref 135–145)

## 2015-06-25 LAB — GLUCOSE, CAPILLARY
GLUCOSE-CAPILLARY: 177 mg/dL — AB (ref 65–99)
GLUCOSE-CAPILLARY: 182 mg/dL — AB (ref 65–99)
Glucose-Capillary: 144 mg/dL — ABNORMAL HIGH (ref 65–99)
Glucose-Capillary: 203 mg/dL — ABNORMAL HIGH (ref 65–99)

## 2015-06-25 LAB — TROPONIN I
Troponin I: <0.03 ng/mL (ref <0.031)
Troponin I: <0.03 ng/mL (ref <0.031)

## 2015-06-25 MED ORDER — GLUCERNA SHAKE PO LIQD: 237.0000 mL | Freq: Three times a day (TID) | ORAL | Status: AC

## 2015-06-25 MED ORDER — VANCOMYCIN 50 MG/ML ORAL SOLUTION: 125.0000 mg | Freq: Four times a day (QID) | ORAL | Status: DC

## 2015-06-25 MED ORDER — CALCIUM CARBONATE 1250 (500 CA) MG PO TABS: 1.0000 | ORAL_TABLET | Freq: Two times a day (BID) | ORAL | Status: AC

## 2015-06-25 MED ORDER — CALCIUM CARBONATE 1250 (500 CA) MG PO TABS
1.0000 | ORAL_TABLET | Freq: Two times a day (BID) | ORAL | Status: DC
  Administered 2015-06-25 – 2015-06-26 (×3): 500 mg via ORAL
  Filled 2015-06-25 (×4): qty 1

## 2015-06-25 MED ORDER — SUCRALFATE 1 G PO TABS: 1.0000 g | ORAL_TABLET | Freq: Three times a day (TID) | ORAL | Status: AC

## 2015-06-25 MED ORDER — CHLORPROMAZINE HCL 10 MG PO TABS: 10.0000 mg | ORAL_TABLET | Freq: Three times a day (TID) | ORAL | Status: AC | PRN

## 2015-06-25 MED ORDER — CHLORPROMAZINE HCL 10 MG PO TABS: 10.0000 mg | ORAL_TABLET | Freq: Three times a day (TID) | ORAL | Status: DC | PRN

## 2015-06-25 MED ORDER — SUCRALFATE 1 G PO TABS: 1.0000 g | ORAL_TABLET | Freq: Three times a day (TID) | ORAL | Status: DC

## 2015-06-25 NOTE — Care Management Note (Signed)
Case Management Note  Patient Details  Name: Matthew Kerr MRN: [id number] Date of Birth: 20-Oct-1956  Subjective/Objective:                   presented with dizziness, after nausea, vomiting and diarrhea for 3-4 days. Action/Plan: Discharge planning  Expected Discharge Date:   (unknown)               Expected Discharge Plan:  Home/Self Care  In-House Referral:     Discharge planning Services  CM Consult, Medication Assistance  Post Acute Care Choice:    Choice offered to:     DME Arranged:    DME Agency:     HH Arranged:    HH Agency:     Status of Service:  In process, will continue to follow  Medicare Important Message Given:    Date Medicare IM Given:    Medicare IM give by:    Date Additional Medicare IM Given:    Additional Medicare Important Message give by:     If discussed at Long Length of Stay Meetings, dates discussed:    Additional Comments: CM received call from RN alerting Cm to consult for cost check on liquid vanc prescription for pt.  CM explains insurance companies not open on weekends to do a price check.  CM called [employer] and [employer] to arrange for compounding: [employer] no longer has a Pension scheme manager on the weekends and [employer] unable to provide this weekend.  CM spoke with pt (through telephonic interpreting) and learns pt is: STAYING AT A LOCAL HOTEL (from [place]); HAS LEFT HIS CAR IN THE [facility] PARKING LOT (pt first went to [facility] ED); NEEDS A FOLLOW UP APPOINTMENT AT THE [facility] (with spanish speaking careteam). MD is discharging pt 06/26/15 and this CM will pass these needs to weekday CM.  Cm is following. Yves Dill, RN 06/25/2015, 3:32 PM

## 2015-06-25 NOTE — Progress Notes (Signed)
[facility] PROGRESS NOTE  Real Matthew Kerr ZOX:[id number] DOB: 1955-12-08 DOA: 06/22/2015 PCP: PROVIDER NOT IN SYSTEM  Assessment/Plan: 59 y/o male with PMH of HTN, DM presented with dizziness, after nausea, vomiting and diarrhea for 3-4 days. He recently was prescribed ciprofloxacin oral as outpatient  -Admitted with AKI with creatinine at 12, and  hyperkalemia. Found to have positive for C diff   1. AKI:  likely prerenal/hypovolemic/dehydration due to nausea, vomiting diarrhea on top of nephrotoxins (ACE, metformin). No hydronephrosis on abd CT -Renal function is improving with IVF.  -Renal function significantly improved. Cr has decreased to 1.2 -Replace magnesium and potasium.   2. Metabolic acidosis, hyperkalemia in the setting of AKI. Hyperkalemia ->resolved. Metabolic acidosis resolved.   3. DM. Check HbA1c at 9.3. Need better blood sugar controlled. . Cont ISS. Discontinue metformin due renal issues. Would hold metformin at discharge.   4. HTN. Currently BP is soft. Continue to hold  lisinopril due to AKI.   5. C diff. Started on PO vanc. Diarrhea resolved. Care management consulted. Need to make sure patient will be able to afford medications.   6. Hypocalcemia: correction by albumin: corrected calcium 7.1. He received IV supplement. Repeat in AM , if still low will replace orally.   7. Hiccups: continue with chlorthalidone. Carafate for GERD.   Code Status: Full  Family Communication: d/w patient.  Disposition Plan: home likely 8-22   Consultants:  Nephrology   Procedures:  none  Antibiotics  vanc 8/18>>>>>   (indicate start date, and stop date if known)  HPI/Subjective: Diarrhea resolved. Hiccups persist/    Objective: Filed Vitals:   06/25/15 0545  BP: 104/52  Pulse: 62  Temp: 98.3 F (36.8 C)  Resp: 18    Intake/Output Summary (Last 24 hours) at 06/25/15 1351 Last data filed at 06/25/15 0900  Gross per 24 hour  Intake 1827.92 ml   Output    300 ml  Net 1527.92 ml   Filed Weights   06/22/15 1941  Weight: 72.3 kg (159 lb 6.3 oz)    Exam:   General:  No distress   Cardiovascular: s1,s2 rrr  Respiratory: CTA BL  Abdomen: soft, nt,nd   Musculoskeletal: no leg edema   Data Reviewed: Basic Metabolic Panel:  Recent Labs Lab 06/22/15 1657 06/23/15 0053 06/23/15 0546 06/23/15 1410 06/23/15 2011 06/24/15 0430 06/25/15 0400  NA 135 137 138  --   --  139 139  K 4.7 3.8 3.8  --   --  3.0* 3.5  CL 94* 91* 91*  --   --  83* 98*  CO2 15* 22 28  --   --  45* 32  GLUCOSE 64* 107* 100*  --   --  138* 148*  BUN 123* 115* 115*  --   --  56* 31*  CREATININE 11.76* 9.60* 7.27*  --   --  2.30* 1.23  CALCIUM 7.0* 6.2* 6.0*  --   --  6.2* 7.2*  MG 1.6*  --   --   --   --  1.4*  --   PHOS  --   --   --  4.6 4.0 2.9  --    Liver Function Tests:  Recent Labs Lab 06/22/15 1115 06/23/15 0546  AST 50* 27  ALT 53 37  ALKPHOS 64 45  BILITOT 1.2 0.9  PROT 8.3* 5.9*  ALBUMIN 4.5 2.9*    Recent Labs Lab 06/22/15 1115  LIPASE 42   No results for input(s): AMMONIA in  the last 168 hours. CBC:  Recent Labs Lab 06/22/15 1115 06/22/15 1657 06/23/15 0546  WBC 11.3* 12.0* 7.9  NEUTROABS  --  8.4*  --   HGB 14.7 13.0 11.8*  HCT 42.1 37.6* 33.6*  MCV 88.6 89.1 87.0  PLT 404* 375 299   Cardiac Enzymes:  Recent Labs Lab 06/22/15 1657  06/24/15 0945 06/24/15 1605 06/24/15 2210 06/25/15 0400 06/25/15 1046  CKTOTAL 1850*  --   --   --   --   --   --   CKMB 40.9*  --   --   --   --   --   --   TROPONINI <0.03  < > <0.03 <0.03 <0.03 <0.03 <0.03  < > = values in this interval not displayed. BNP (last 3 results) No results for input(s): BNP in the last 8760 hours.  ProBNP (last 3 results) No results for input(s): PROBNP in the last 8760 hours.  CBG:  Recent Labs Lab 06/24/15 1145 06/24/15 1632 06/24/15 2205 06/25/15 0857 06/25/15 1204  GLUCAP 213* 194* 177* 177* 182*    Recent Results  (from the past 240 hour(s))  C difficile quick scan w PCR reflex     Status: Abnormal   Collection Time: 06/22/15  5:36 PM  Result Value Ref Range Status   C Diff antigen POSITIVE (A) NEGATIVE Final   C Diff toxin POSITIVE (A) NEGATIVE Final   C Diff interpretation Positive for toxigenic C. difficile  Final    Comment: CRITICAL RESULT CALLED TO, READ BACK BY AND VERIFIED WITH: Bernie Covey [id number] @ 1909 BY J SCOTTON      Studies: No results found.  Scheduled Meds: . calcium carbonate  1 tablet Oral BID WC  . feeding supplement (GLUCERNA SHAKE)  237 mL Oral TID BM  . heparin  5,000 Units Subcutaneous 3 times per day  . insulin aspart  0-9 Units Subcutaneous TID WC  . sodium chloride  3 mL Intravenous Q12H  . sucralfate  1 g Oral TID WC & HS  . vancomycin  125 mg Oral 4 times per day   Continuous Infusions: . sodium chloride 125 mL/hr at 06/25/15 0908    Principal Problem:   Acute kidney injury Active Problems:   Hypertension, essential, benign   Diabetes type 2, uncontrolled   Malnutrition of moderate degree    Time spent: >35 minutes     [name]  [facility] Pager [phone]. If 7PM-7AM, please contact night-coverage at [email or web address], password [facility] 06/25/2015, 1:51 PM  LOS: 3 days

## 2015-06-25 NOTE — Progress Notes (Signed)
Dr. Sunnie Nielsen updated on status from CM and RNs attempts to find pharmacy that has oral Vanc. Patient's own pharmacy does not carry it,  and additional pharmacies in area that typical carry medication contacted and they do not have medication available on Sunday. CM stated that tomorrow patient is more likely to be able to obtain medication pending his insurance cost at [facility]. Dr. Sunnie Nielsen telephone order to cancel discharge for today because we can not discharge patient home without medication.

## 2015-06-26 DIAGNOSIS — E1165 Type 2 diabetes mellitus with hyperglycemia: Secondary | ICD-10-CM

## 2015-06-26 DIAGNOSIS — N179 Acute kidney failure, unspecified: Secondary | ICD-10-CM

## 2015-06-26 DIAGNOSIS — A048 Other specified bacterial intestinal infections: Secondary | ICD-10-CM

## 2015-06-26 DIAGNOSIS — E872 Acidosis: Secondary | ICD-10-CM

## 2015-06-26 LAB — GI PATHOGEN PANEL BY PCR, STOOL
CRYPTOSPORIDIUM BY PCR: NOT DETECTED
Campylobacter by PCR: NOT DETECTED
E COLI (ETEC) LT/ST: NOT DETECTED
E COLI 0157 BY PCR: NOT DETECTED
E coli (STEC): NOT DETECTED
G LAMBLIA BY PCR: NOT DETECTED
NOROVIRUS G1/G2: NOT DETECTED
Rotavirus A by PCR: NOT DETECTED
Salmonella by PCR: NOT DETECTED
Shigella by PCR: NOT DETECTED

## 2015-06-26 LAB — BASIC METABOLIC PANEL
ANION GAP: 6 (ref 5–15)
BUN: 16 mg/dL (ref 6–20)
CALCIUM: 8.2 mg/dL — AB (ref 8.9–10.3)
CO2: 29 mmol/L (ref 22–32)
CREATININE: 1.17 mg/dL (ref 0.61–1.24)
Chloride: 104 mmol/L (ref 101–111)
GFR calc Af Amer: >60 mL/min (ref >60)
GLUCOSE: 155 mg/dL — AB (ref 65–99)
Potassium: 4.1 mmol/L (ref 3.5–5.1)
Sodium: 139 mmol/L (ref 135–145)

## 2015-06-26 LAB — TROPONIN I

## 2015-06-26 LAB — GLUCOSE, CAPILLARY
GLUCOSE-CAPILLARY: 198 mg/dL — AB (ref 65–99)
Glucose-Capillary: 152 mg/dL — ABNORMAL HIGH (ref 65–99)

## 2015-06-26 MED ORDER — FAMOTIDINE 20 MG PO TABS: 20.0000 mg | ORAL_TABLET | Freq: Two times a day (BID) | ORAL | Status: AC

## 2015-06-26 MED ORDER — FAMOTIDINE 20 MG PO TABS: 20.0000 mg | ORAL_TABLET | Freq: Two times a day (BID) | ORAL | Status: DC

## 2015-06-26 MED ORDER — VANCOMYCIN HCL 125 MG PO CAPS: 125.0000 mg | ORAL_CAPSULE | Freq: Four times a day (QID) | ORAL | Status: DC

## 2015-06-26 MED ORDER — VANCOMYCIN HCL 125 MG PO CAPS: 125.0000 mg | ORAL_CAPSULE | Freq: Four times a day (QID) | ORAL | Status: AC

## 2015-06-26 NOTE — Care Management Note (Addendum)
Case Management Note  Patient Details  Name: Matthew Kerr MRN: [id number] Date of Birth: 11/30/1955  Subjective/Objective:     Patient is from [place], he is for dc today, his insurance covers capsules only, does not cover liquid vancomycin.  The capsules is only $10 for patient, he will go to our outpatient pharmacy to pick up his medications.  Per benefit check insurance does not covder liquid vancomycin, but covers capsules for $10.       Action/Plan:   Expected Discharge Date:   (unknown)               Expected Discharge Plan:  Home/Self Care  In-House Referral:     Discharge planning Services  CM Consult, Medication Assistance  Post Acute Care Choice:    Choice offered to:     DME Arranged:    DME Agency:     HH Arranged:    HH Agency:     Status of Service:  Completed, signed off  Medicare Important Message Given:    Date Medicare IM Given:    Medicare IM give by:    Date Additional Medicare IM Given:    Additional Medicare Important Message give by:     If discussed at Long Length of Stay Meetings, dates discussed:    Additional Comments:  Leone Haven, RN 06/26/2015, 11:30 AM

## 2015-06-26 NOTE — Discharge Summary (Addendum)
Physician Discharge Summary  Matthew Kerr WUJ:[id number] DOB: 1956-07-30 DOA: 06/22/2015  PCP: PROVIDER NOT IN SYSTEM  Admit date: 06/22/2015 Discharge date: 06/26/2015  Time spent: 35 minutes  Recommendations for Outpatient Follow-up:  #1 Discharge home with [facility]. Please discuss on starting insulin during outpatient follow-up. #2 Patient will complete a total 14 days of oral vancomycin for C. difficile on 07/06/2015 #3 . Check renal function and CPK during outpatient follow-up.  Discharge Diagnoses:  Principal Problem:   Acute kidney injury   Active Problems:   Hypertension, essential, benign   Diabetes type 2, uncontrolled   Malnutrition of moderate degree   Metabolic acidosis Hyperkalemia  C. difficile colitis   Discharge Condition: Fair  Diet recommendation: Diabetic  Filed Weights   06/22/15 1941  Weight: 72.3 kg (159 lb 6.3 oz)    History of present illness:  Please refer to admission H&P for details, in brief, 59 year old male from [place] currently living in [place] for 3 months , with hypertension, uncontrolled diabetes presented with dizziness, nausea, vomiting and diarrhea for the past 3-4 days. He was recently prescribed ciprofloxacin as outpatient. Patient was found to have acute kidney EGD with creatinine of 12 and hyperkalemia. He was also found to have positive stool for C. difficile. CPK was also elevated to 1800.  Hospital Course:  Acute kidney injury Appears to be prerenal due to hypovolemia and dehydration associated with, nausea, vomiting with ongoing use of ACE inhibitor and metformin. Abdominal CT without any obstruction or hydronephrosis. Placed on IV hydration with improvement of Function to Normal. Replenished lobe magnesium. Seen by renal consult and no further recommendations. Patient instructed to avoid NSAIDs and keep himself well-hydrated.  Metabolic acidosis with hyperkalemia    secondary to acute kidney injury/ATN. Now resolved Discontinued metformin and ACE inhibitor.  Uncontrolled type 2 diabetes mellitus A1c of 9.3. Discontinued metformin due to acute kidney injury. Patient not sure if he can start on insulin at this time. Will resume his glyburide upon discharge. Discontinued ACE inhibitor.   C. difficile enteritis On oral vancomycin with plan to treat for total 14 days. (Pressure and given for 10 days)  Hypocalcemia Received IV supplement.  GERD Discontinue Protonix. Prescribed Carafate and Pepcid.  Hypertension Blood pressure stable. ACE inhibitor discontinued.    Procedures:  None  Consultations:  None  Discharge Exam: Filed Vitals:   06/26/15 0455  BP: 120/81  Pulse: 62  Temp: 99.1 F (37.3 C)  Resp: 18    General: Middle aged male in no acute distress HEENT: No pallor, most oral mucosa Chest: Clear to auscultation bilaterally CVS: Normal S1 and S2, no murmurs rub or gallop GI: Soft, nondistended, nontender, bowel sounds present Musculoskeletal: Warm, no edema  CNS: Alert and oriented  Discharge Instructions   Discharge Instructions    Diet - low sodium heart healthy    Complete by:  As directed      Increase activity slowly    Complete by:  As directed           Current Discharge Medication List    START taking these medications   Details  calcium carbonate (OS-CAL - DOSED IN MG OF ELEMENTAL CALCIUM) 1250 (500 CA) MG tablet Take 1 tablet (500 mg of elemental calcium total) by mouth 2 (two) times daily with a meal. Qty: 10 tablet, Refills: 0    chlorproMAZINE (THORAZINE) 10 MG tablet Take 1 tablet (10 mg total) by mouth 3 (three) times daily  as needed for hiccoughs. Qty: 30 tablet, Refills: 0    famotidine (PEPCID) 20 MG tablet Take 1 tablet (20 mg total) by mouth 2 (two) times daily. Qty: 30 tablet, Refills: 0    feeding supplement, GLUCERNA SHAKE, (GLUCERNA SHAKE) LIQD Take 237 mLs by mouth 3 (three) times  daily between meals. Qty: 30 Can, Refills: 0    sucralfate (CARAFATE) 1 G tablet Take 1 tablet (1 g total) by mouth 4 (four) times daily -  with meals and at bedtime. Qty: 60 tablet, Refills: 0    vancomycin (VANCOCIN) 125 MG capsule Take 1 capsule (125 mg total) by mouth 4 (four) times daily. Qty: 40 capsule, Refills: 0      CONTINUE these medications which have NOT CHANGED   Details  glipiZIDE (GLUCOTROL) 10 MG tablet Take 10 mg by mouth 2 (two) times daily before a meal.  Refills: 3      STOP taking these medications     lisinopril (PRINIVIL,ZESTRIL) 20 MG tablet      metFORMIN (GLUCOPHAGE) 1000 MG tablet      pantoprazole (PROTONIX) 40 MG tablet      ciprofloxacin (CIPRO) 500 MG tablet        No Known Allergies Follow-up Information    Follow up with [facility] On 06/29/2015.   Specialty:  Internal Medicine   Why:  1 pm for hospital follow up please bring co pay of $30   Contact information:   [address] [place] [address] [phone]       The results of significant diagnostics from this hospitalization (including imaging, microbiology, ancillary and laboratory) are listed below for reference.    Significant Diagnostic Studies: Ct Abdomen Pelvis Wo Contrast  06/22/2015   CLINICAL DATA:  Abdominal pain nausea, vomiting, and diarrhea for several days  EXAM: CT ABDOMEN AND PELVIS WITHOUT CONTRAST  TECHNIQUE: Multidetector CT imaging of the abdomen and pelvis was performed following the standard protocol without IV contrast. Oral contrast was administered.  COMPARISON:  None.  FINDINGS: There is slight bibasilar lung atelectasis. Lung bases are otherwise are clear. There is a small amount of contrast in the distal esophagus, likely spontaneous gastroesophageal reflux.  Liver shows decreased attenuation, consistent with a degree of hepatic steatosis. No focal liver lesions are identified on this nonintravenous contrast enhanced  study. Gallbladder is absent. There is no biliary duct dilatation.  Spleen, pancreas, and adrenals appear normal.  Kidneys bilaterally show no mass or hydronephrosis on either side. There are no renal or ureteral calculi on either side.  In the pelvis, the urinary bladder is midline with wall thickness within normal limits. There are multiple prostatic calculi present. There is a slight degree of fat in the left inguinal ring. There are sigmoid diverticula without diverticulitis. There is no pelvic mass or pelvic fluid collection. The appendix appears within normal limits.  There are scattered diverticula throughout the remainder the colon without diverticulitis. There is no bowel obstruction. No free air or portal venous air. There is no demonstrable ascites, adenopathy, or abscess in the abdomen or pelvis. There is no abdominal aortic aneurysm. There is degenerative change in the lumbar spine. No blastic or lytic bone lesions.  IMPRESSION: Scattered colonic diverticula without diverticulitis. No bowel obstruction. No abscess. Appendix appears normal.  No renal or ureteral calculus.  No hydronephrosis.  Multiple prostatic calculi.  Contrast in the distal esophagus is felt to represent a degree of spontaneous gastroesophageal reflux.  There  is a degree of hepatic steatosis.  Gallbladder absent.   Electronically Signed   By: Bretta Bang III M.D.   On: 06/22/2015 14:39   X-ray Chest Pa And Lateral  06/22/2015   CLINICAL DATA:  Generalized abdominal pain and chest pain for 1 week.  EXAM: CHEST  2 VIEW  COMPARISON:  None.  FINDINGS: The cardiac silhouette, mediastinal and hilar contours are within normal limits. The lungs are clear. No pleural effusion and or pneumothorax. The bony thorax is intact.  IMPRESSION: No acute cardiopulmonary findings.   Electronically Signed   By: Rudie Meyer M.D.   On: 06/22/2015 16:38    Microbiology: Recent Results (from the past 240 hour(s))  C difficile quick scan w PCR  reflex     Status: Abnormal   Collection Time: 06/22/15  5:36 PM  Result Value Ref Range Status   C Diff antigen POSITIVE (A) NEGATIVE Final   C Diff toxin POSITIVE (A) NEGATIVE Final   C Diff interpretation Positive for toxigenic C. difficile  Final    Comment: CRITICAL RESULT CALLED TO, READ BACK BY AND VERIFIED WITH: Bernie Covey [id number] @ 1909 BY J SCOTTON      Labs: Basic Metabolic Panel:  Recent Labs Lab 06/22/15 1657 06/23/15 0053 06/23/15 0546 06/23/15 1410 06/23/15 2011 06/24/15 0430 06/25/15 0400 06/26/15 0424  NA 135 137 138  --   --  139 139 139  K 4.7 3.8 3.8  --   --  3.0* 3.5 4.1  CL 94* 91* 91*  --   --  83* 98* 104  CO2 15* 22 28  --   --  45* 32 29  GLUCOSE 64* 107* 100*  --   --  138* 148* 155*  BUN 123* 115* 115*  --   --  56* 31* 16  CREATININE 11.76* 9.60* 7.27*  --   --  2.30* 1.23 1.17  CALCIUM 7.0* 6.2* 6.0*  --   --  6.2* 7.2* 8.2*  MG 1.6*  --   --   --   --  1.4*  --   --   PHOS  --   --   --  4.6 4.0 2.9  --   --    Liver Function Tests:  Recent Labs Lab 06/22/15 1115 06/23/15 0546  AST 50* 27  ALT 53 37  ALKPHOS 64 45  BILITOT 1.2 0.9  PROT 8.3* 5.9*  ALBUMIN 4.5 2.9*    Recent Labs Lab 06/22/15 1115  LIPASE 42   No results for input(s): AMMONIA in the last 168 hours. CBC:  Recent Labs Lab 06/22/15 1115 06/22/15 1657 06/23/15 0546  WBC 11.3* 12.0* 7.9  NEUTROABS  --  8.4*  --   HGB 14.7 13.0 11.8*  HCT 42.1 37.6* 33.6*  MCV 88.6 89.1 87.0  PLT 404* 375 299   Cardiac Enzymes:  Recent Labs Lab 06/22/15 1657  06/25/15 0400 06/25/15 1046 06/25/15 1649 06/25/15 2211 06/26/15 0424  CKTOTAL 1850*  --   --   --   --   --   --   CKMB 40.9*  --   --   --   --   --   --   TROPONINI <0.03  < > <0.03 <0.03 <0.03 <0.03 <0.03  < > = values in this interval not displayed. BNP: BNP (last 3 results) No results for input(s): BNP in the last 8760 hours.  ProBNP (last 3 results) No results for input(s): PROBNP in  the last 8760 hours.  CBG:  Recent Labs Lab 06/25/15 0857 06/25/15 1204 06/25/15 1722 06/25/15 2140 06/26/15 0813  GLUCAP 177* 182* 144* 203* 198*       Signed:  Keneisha Heckart  [facility] 06/26/2015, 10:42 AM

## 2015-06-26 NOTE — Progress Notes (Signed)
Pt. Received discharge instructions, prescriptions and note for work. Educated pt. On follow-up appointments. No new skin issues noted. All questions answered. No further needs noted at this time.

## 2015-06-26 NOTE — Discharge Instructions (Signed)
Insuficiencia renal (Kidney Failure) La insuficiencia renal ocurre cuando los riones no pueden eliminar los deshechos lquidos que se acumulan en la sangre de forma natural, luego de que el cuerpo ha procesado los alimentos. Esto conduce a una peligrosa acumulacin de lquidos y productos de Physiological scientist. CUIDADOS EN EL HOGAR  Siga la dieta segn las indicaciones del mdico.  Tome los medicamentos segn le haya indicado el mdico.  Cumpla con todas las visitas para dilisis. Informe si no puede asistir a alguna visita. SOLICITE AYUDA DE INMEDIATO SI:   Produce mucha ms o muy poca orina.  Se le hinchan (inflaman) el rostro o los tobillos.  Le falta el aire.  Siente debilidad, se siente cansado, o no tiene hambre (prdida del apetito).  Se siente mal sin una razn aparente. ASEGRESE DE QUE:   Comprende estas instrucciones.  Controlar su enfermedad.  Solicitar ayuda de inmediato si no mejora o empeora. Document Released: 11/23/2010 Document Revised: 01/13/2012 [facility] Patient Information 2015 [place], [place]. This information is not intended to replace advice given to you by your health care provider. Make sure you discuss any questions you have with your health care provider.    La diabetes mellitus y los alimentos (Diabetes Mellitus and Food) Es importante que controle su nivel de azcar en la sangre (glucosa). El nivel de glucosa en sangre depende en gran medida de lo que usted come. Comer alimentos saludables en las cantidades [place] a lo largo del Futures trader, aproximadamente a la misma hora [employer], lo ayudar a Chief Operating Officer su nivel de Event organiser. Tambin puede ayudarlo a retrasar o Fish farm manager de la diabetes mellitus. Comer de [employer] saludable incluso puede ayudarlo a Event organiser de presin arterial y a Barista o Pharmacologist un peso saludable.  CMO PUEDEN AFECTARME LOS ALIMENTOS? Carbohidratos Los carbohidratos afectan el nivel de  glucosa en sangre ms que cualquier otro tipo de alimento. El nutricionista lo ayudar a Chief Strategy Officer cuntos carbohidratos puede consumir en cada comida y ensearle a contarlos. El recuento de carbohidratos es importante para mantener la glucosa en sangre en un nivel saludable, en especial si utiliza insulina o toma determinados medicamentos para la diabetes mellitus. Alcohol El alcohol puede provocar disminuciones sbitas de la glucosa en sangre (hipoglucemia), en especial si utiliza insulina o toma determinados medicamentos para la diabetes mellitus. La hipoglucemia es una afeccin que puede poner en peligro la vida. Los sntomas de la hipoglucemia (somnolencia, mareos y Administrator) son similares a los sntomas de haber consumido mucho alcohol.  Si el mdico lo autoriza a beber alcohol, hgalo con moderacin y siga estas pautas:  Las mujeres no deben beber ms de un trago por da, y los hombres no deben beber ms de dos tragos por Futures trader. Un trago es igual a:  12 onzas (355 ml) de cerveza  5 onzas de vino (150 ml) de vino  1,5onzas (45ml) de bebidas espirituosas  No beba con el estmago vaco.  Mantngase hidratado. Beba agua, gaseosas dietticas o t helado sin azcar.  Las gaseosas comunes, los jugos y otros refrescos podran contener muchos carbohidratos y se Heritage manager. QU ALIMENTOS NO SE RECOMIENDAN? Cuando haga las elecciones de alimentos, es importante que recuerde que todos los alimentos son distintos. Algunos tienen menos nutrientes que otros por porcin, aunque podran tener la misma cantidad de caloras o carbohidratos. Es difcil darle al cuerpo lo que necesita cuando consume alimentos con menos nutrientes. Estos son algunos ejemplos de alimentos que Hotel manager ya  que contienen muchas caloras y carbohidratos, pero pocos nutrientes:  Grasas trans (la mayora de los alimentos procesados incluyen grasas trans en la etiqueta de Informacin nutricional).  Gaseosas  comunes.  Jugos.  Caramelos.  Dulces, como tortas, pasteles, rosquillas y [place].  Comidas fritas. QU ALIMENTOS PUEDO COMER? Consuma alimentos ricos en nutrientes, que nutrirn el cuerpo y lo mantendrn saludable. Los alimentos que debe comer tambin dependern de varios factores, como:  Las caloras que necesita.  Los medicamentos que toma.  Su peso.  El nivel de glucosa en [place].  El [place] de presin arterial.  El nivel de colesterol. Tambin debe consumir una variedad de [place], como:  Protenas, como carne, aves, pescado, tofu, frutos secos y semillas (las protenas de [place] magros son mejores).  Nils Pyle.  Verduras.  Productos lcteos, como [place], queso y yogur (descremados son mejores).  Panes, granos, pastas, cereales, arroz y frijoles.  Grasas, como aceite de [place], [place] sin grasas trans, aceite de canola, aguacate y [place]. TODOS LOS QUE PADECEN DIABETES MELLITUS TIENEN EL MISMO PLAN DE COMIDAS? Dado que todas las personas que padecen diabetes mellitus son distintas, no hay un solo plan de comidas que funcione para todos. Es muy importante que se rena con un nutricionista que lo ayudar a crear un plan de comidas adecuado para usted. Document Released: 01/28/2008 Document Revised: 10/26/2013 [facility] Patient Information 2015 [place], [place]. This information is not intended to replace advice given to you by your health care provider. Make sure you discuss any questions you have with your health care provider.

## 2015-06-29 ENCOUNTER — Encounter: Payer: Self-pay | Admitting: Family Medicine

## 2015-06-29 ENCOUNTER — Ambulatory Visit (INDEPENDENT_AMBULATORY_CARE_PROVIDER_SITE_OTHER): Payer: BLUE CROSS/BLUE SHIELD | Admitting: Family Medicine

## 2015-06-29 VITALS — BP 114/72 | HR 81 | Temp 98.8°F | Wt 153.0 lb

## 2015-06-29 DIAGNOSIS — N179 Acute kidney failure, unspecified: Secondary | ICD-10-CM | POA: Diagnosis not present

## 2015-06-29 DIAGNOSIS — E875 Hyperkalemia: Secondary | ICD-10-CM | POA: Diagnosis not present

## 2015-06-29 DIAGNOSIS — A047 Enterocolitis due to Clostridium difficile: Secondary | ICD-10-CM | POA: Diagnosis not present

## 2015-06-29 DIAGNOSIS — E118 Type 2 diabetes mellitus with unspecified complications: Secondary | ICD-10-CM

## 2015-06-29 DIAGNOSIS — A0472 Enterocolitis due to Clostridium difficile, not specified as recurrent: Secondary | ICD-10-CM

## 2015-06-29 LAB — COMPLETE METABOLIC PANEL WITH GFR
ALBUMIN: 3.9 g/dL (ref 3.6–5.1)
ALK PHOS: 60 U/L (ref 40–115)
ALT: 60 U/L — ABNORMAL HIGH (ref 9–46)
AST: 38 U/L — ABNORMAL HIGH (ref 10–35)
BUN: 33 mg/dL — ABNORMAL HIGH (ref 7–25)
CALCIUM: 9.1 mg/dL (ref 8.6–10.3)
CHLORIDE: 100 mmol/L (ref 98–110)
CO2: 23 mmol/L (ref 20–31)
Creat: 1.42 mg/dL — ABNORMAL HIGH (ref 0.70–1.33)
GFR, EST NON AFRICAN AMERICAN: 54 mL/min — AB (ref >=60)
GFR, Est African American: 63 mL/min (ref >=60)
GLUCOSE: 224 mg/dL — AB (ref 65–99)
POTASSIUM: 4.5 mmol/L (ref 3.5–5.3)
SODIUM: 138 mmol/L (ref 135–146)
Total Bilirubin: 0.6 mg/dL (ref 0.2–1.2)
Total Protein: 6.8 g/dL (ref 6.1–8.1)

## 2015-06-29 LAB — CK: CK TOTAL: 408 U/L — AB (ref 7–232)

## 2015-06-29 MED ORDER — INSULIN PEN NEEDLE 32G X 4 MM MISC: Status: AC

## 2015-06-29 MED ORDER — INSULIN GLARGINE 100 UNIT/ML SOLOSTAR PEN: 20.0000 [IU] | PEN_INJECTOR | Freq: Every day | SUBCUTANEOUS | Status: AC

## 2015-06-29 NOTE — Patient Instructions (Signed)
Start your new insulin. Take 30 units at bedtime Check your blood sugar 3 times a day at different times and record the time and the reading Come back in 2 weeks with your readings. Finish all medications prescribed in ED Will will call  You if anything in your labs needs attention.

## 2015-06-29 NOTE — Progress Notes (Signed)
Patient ID: Matthew Kerr, male   DOB: [birth year], 59 y.o.   MRN: [id number]   Matthew Kerr, is a 59 y.o. male  WGN:[id number]  HQI:[id number]  DOB - 11-11-1955  CC:  Chief Complaint  Patient presents with  . Follow-up       HPI: Matthew Kerr is a 59 y.o. male here to establish care No Known Allergies Past Medical History  Diagnosis Date  . Diabetes mellitus without complication   . Hypertension    Current Outpatient Prescriptions on File Prior to Visit  Medication Sig Dispense Refill  . calcium carbonate (OS-CAL - DOSED IN MG OF ELEMENTAL CALCIUM) 1250 (500 CA) MG tablet Take 1 tablet (500 mg of elemental calcium total) by mouth 2 (two) times daily with a meal. 10 tablet 0  . chlorproMAZINE (THORAZINE) 10 MG tablet Take 1 tablet (10 mg total) by mouth 3 (three) times daily as needed for hiccoughs. 30 tablet 0  . famotidine (PEPCID) 20 MG tablet Take 1 tablet (20 mg total) by mouth 2 (two) times daily. 30 tablet 0  . feeding supplement, GLUCERNA SHAKE, (GLUCERNA SHAKE) LIQD Take 237 mLs by mouth 3 (three) times daily between meals. 30 Can 0  . glipiZIDE (GLUCOTROL) 10 MG tablet Take 10 mg by mouth 2 (two) times daily before a meal.   3  . sucralfate (CARAFATE) 1 G tablet Take 1 tablet (1 g total) by mouth 4 (four) times daily -  with meals and at bedtime. 60 tablet 0  . vancomycin (VANCOCIN) 125 MG capsule Take 1 capsule (125 mg total) by mouth 4 (four) times daily. 40 capsule 0   No current facility-administered medications on file prior to visit.   No family history on file. Social History   Social History  . Marital Status: Married    Spouse Name: N/A  . Number of Children: N/A  . Years of Education: N/A   Occupational History  . Not on file.   Social History Main Topics  . Smoking status: Former Games developer  . Smokeless tobacco: Not on file  . Alcohol Use: No  . Drug Use: No  . Sexual Activity: Not on file   Other Topics Concern  . Not on file    Social History Narrative    Review of Systems: Constitutional: Negative for fever, chills, appetite change, weight loss,  fatigue. HENT: Negative for ear pain, ear discharge.nose bleeds Eyes: Negative for pain, discharge, redness, itching and visual disturbance. Neck: Negative for pain, stiffness Respiratory: Negative for cough, shortness of breath,   Cardiovascular: Negative for chest pain, palpitations and leg swelling. Gastrointestinal: Negative for abdominal distention, abdominal pain, nausea, vomiting, diarrhea, constipations Genitourinary: Negative for dysuria, urgency, frequency, hematuria, flank pain,  Musculoskeletal: Negative for back pain, joint pain, joint  swelling, arthralgia and gait problem.Negative for weakness. Neurological: Negative for dizziness, tremors, seizures, syncope,   light-headedness, numbness and headaches.  Hematological: Negative for easy bruising or bleeding Psychiatric/Behavioral: Negative for depression, anxiety, decreased concentration, confusion    Objective:   Filed Vitals:   06/29/15 1304  BP: 114/72  Pulse: 81  Temp: 98.8 F (37.1 C)    Physical Exam: Constitutional: Patient appears well-developed and well-nourished. No distress. HENT: Normocephalic, atraumatic, External right and left ear normal. Oropharynx is clear and moist.  Eyes: Conjunctivae and EOM are normal. PERRLA, no scleral icterus. Neck: Normal ROM. Neck supple. No lymphadenopathy, No thyromegaly. CVS: RRR, S1/S2 +, no murmurs, no gallops, no rubs Pulmonary: Effort and breath sounds normal, no stridor,  rhonchi, wheezes, rales.  Abdominal: Soft. Normoactive BS,, no distension, tenderness, rebound or guarding.  Musculoskeletal: Normal range of motion. No edema and no tenderness.  Neuro: Alert.Normal muscle tone coordination. Non-focal Skin: Skin is warm and dry. No rash noted. Not diaphoretic. No erythema. No pallor. Psychiatric: Normal mood and affect. Behavior, judgment,  thought content normal.  Lab Results  Component Value Date   WBC 7.9 06/23/2015   HGB 11.8* 06/23/2015   HCT 33.6* 06/23/2015   MCV 87.0 06/23/2015   PLT 299 06/23/2015   Lab Results  Component Value Date   CREATININE 1.17 06/26/2015   BUN 16 06/26/2015   NA 139 06/26/2015   K 4.1 06/26/2015   CL 104 06/26/2015   CO2 29 06/26/2015    Lab Results  Component Value Date   HGBA1C 9.3* 06/23/2015   Lipid Panel  No results found for: CHOL, TRIG, HDL, CHOLHDL, VLDL, LDLCALC     Assessment and plan:   1. Acute renal failure, unspecified acute renal failure type  - COMPLETE METABOLIC PANEL WITH GFR - CK  2. Clostridium difficile diarrhea  - COMPLETE METABOLIC PANEL WITH GFR - CK  3. Hyperkalemia  - COMPLETE METABOLIC PANEL WITH GFR - CK  4. Type 2 diabetes mellitus with complication  - COMPLETE METABOLIC PANEL WITH GFR -Lantus Solostar #5 30 units at HS.    Return in about 2 weeks (around 07/13/2015).  The patient was given clear instructions to go to ER or return to medical center if symptoms don't improve, worsen or new problems develop. The patient verbalized understanding.      Henrietta Hoover, MSN, FNP-BC   06/29/2015, 1:50 PM

## 2015-07-13 ENCOUNTER — Ambulatory Visit (INDEPENDENT_AMBULATORY_CARE_PROVIDER_SITE_OTHER): Payer: BLUE CROSS/BLUE SHIELD | Admitting: Family Medicine

## 2015-07-13 VITALS — BP 137/75 | HR 82 | Temp 97.8°F | Resp 14 | Ht 65.0 in | Wt 157.0 lb

## 2015-07-13 DIAGNOSIS — Z0000 Encounter for general adult medical examination without abnormal findings: Secondary | ICD-10-CM | POA: Diagnosis not present

## 2015-07-13 DIAGNOSIS — N179 Acute kidney failure, unspecified: Secondary | ICD-10-CM | POA: Diagnosis not present

## 2015-07-13 DIAGNOSIS — Z23 Encounter for immunization: Secondary | ICD-10-CM

## 2015-07-13 LAB — COMPLETE METABOLIC PANEL WITH GFR
ALK PHOS: 61 U/L (ref 40–115)
ALT: 53 U/L — AB (ref 9–46)
AST: 31 U/L (ref 10–35)
Albumin: 4.2 g/dL (ref 3.6–5.1)
BILIRUBIN TOTAL: 0.6 mg/dL (ref 0.2–1.2)
BUN: 21 mg/dL (ref 7–25)
CALCIUM: 9.4 mg/dL (ref 8.6–10.3)
CO2: 27 mmol/L (ref 20–31)
CREATININE: 1.17 mg/dL (ref 0.70–1.33)
Chloride: 108 mmol/L (ref 98–110)
GFR, EST AFRICAN AMERICAN: 80 mL/min (ref >=60)
GFR, Est Non African American: 69 mL/min (ref >=60)
Glucose, Bld: 193 mg/dL — ABNORMAL HIGH (ref 65–99)
Potassium: 4.2 mmol/L (ref 3.5–5.3)
Sodium: 144 mmol/L (ref 135–146)
TOTAL PROTEIN: 6.9 g/dL (ref 6.1–8.1)

## 2015-07-13 LAB — LIPID PANEL
CHOLESTEROL: 139 mg/dL (ref 125–200)
HDL: 42 mg/dL (ref >=40)
LDL Cholesterol: 74 mg/dL (ref <130)
TRIGLYCERIDES: 116 mg/dL (ref <150)
Total CHOL/HDL Ratio: 3.3 Ratio (ref <=5.0)
VLDL: 23 mg/dL (ref <30)

## 2015-07-13 LAB — TSH: TSH: 0.499 u[IU]/mL (ref 0.350–4.500)

## 2015-07-13 NOTE — Progress Notes (Signed)
Patient ID: Matthew Kerr, male   DOB: 1956/04/18, 59 y.o.   MRN: [id number]   Subjective:  Patient ID: Matthew Kerr, male    DOB: 02-Jul-1956  Age: 59 y.o. MRN: [id number]  CC: No chief complaint on file.   HPI Matthew Kerr presents for follow-up visit.He was hospitalized from 8/18-8/22 with acute renal failure.He has been seen here for follow-up since that time and todays visit is to confirm that the renal failure has resolved. He also has type II diabetes and is on Lantus 20 units at HS and glipizide 10 daily. He reports his blood sugars range rom 120-140 in AM fasting and 180-200 in the afternoon. He reports feeling back to normal w/o specific complaints today.   Outpatient Prescriptions Prior to Visit  Medication Sig Dispense Refill  . calcium carbonate (OS-CAL - DOSED IN MG OF ELEMENTAL CALCIUM) 1250 (500 CA) MG tablet Take 1 tablet (500 mg of elemental calcium total) by mouth 2 (two) times daily with a meal. 10 tablet 0  . glipiZIDE (GLUCOTROL) 10 MG tablet Take 10 mg by mouth 2 (two) times daily before a meal.   3  . Insulin Glargine (LANTUS SOLOSTAR) 100 UNIT/ML Solostar Pen Inject 20 Units into the skin daily at 10 pm. 5 pen PRN  . Insulin Pen Needle 32G X 4 MM MISC Check blood sugar 3 times a day at different times 100 each 1  . sucralfate (CARAFATE) 1 G tablet Take 1 tablet (1 g total) by mouth 4 (four) times daily -  with meals and at bedtime. 60 tablet 0  . chlorproMAZINE (THORAZINE) 10 MG tablet Take 1 tablet (10 mg total) by mouth 3 (three) times daily as needed for hiccoughs. (Patient not taking: Reported on 07/13/2015) 30 tablet 0  . famotidine (PEPCID) 20 MG tablet Take 1 tablet (20 mg total) by mouth 2 (two) times daily. (Patient not taking: Reported on 07/13/2015) 30 tablet 0  . feeding supplement, GLUCERNA SHAKE, (GLUCERNA SHAKE) LIQD Take 237 mLs by mouth 3 (three) times daily between meals. (Patient not taking: Reported on 07/13/2015) 30 Can 0  . vancomycin (VANCOCIN)  125 MG capsule Take 1 capsule (125 mg total) by mouth 4 (four) times daily. (Patient not taking: Reported on 07/13/2015) 40 capsule 0   No facility-administered medications prior to visit.    ROS Review of Systems  Constitutional: Negative for fever, chills, appetite change, fatigue and unexpected weight change.  HENT: Negative for congestion, ear discharge, ear pain, hearing loss and nosebleeds.   Eyes: Negative for pain, discharge, itching and visual disturbance.  Respiratory: Negative for cough, shortness of breath and wheezing.   Cardiovascular: Negative for chest pain, palpitations and leg swelling.  Gastrointestinal: Negative for nausea, vomiting, diarrhea, constipation and abdominal distention.  Endocrine: Negative for polydipsia, polyphagia and polyuria.  Genitourinary: Negative for dysuria, urgency, frequency, hematuria and flank pain.  Musculoskeletal: Negative for back pain, joint swelling, gait problem, neck pain and neck stiffness.  Skin: Negative for rash and wound.  Neurological: Negative for dizziness, tremors, seizures, syncope, weakness, numbness and headaches.  Hematological: Does not bruise/bleed easily.  Psychiatric/Behavioral: Negative for confusion and sleep disturbance. The patient is not nervous/anxious.     Objective:  BP 137/75 mmHg  Pulse 82  Temp(Src) 97.8 F (36.6 C) (Oral)  Resp 14  Ht 5\' 5"  (1.651 m)  Wt 157 lb (71.215 kg)  BMI 26.13 kg/m2  BP/Weight 07/13/2015 06/29/2015 06/26/2015  Systolic BP 137 114 120  Diastolic BP 75 72 81  Wt. (Lbs) 157 153 -  BMI 26.13 25.46 -      Physical Exam  Constitutional: He is oriented to person, place, and time. He appears well-developed and well-nourished.  HENT:  Head: Normocephalic and atraumatic.  Right Ear: External ear normal.  Left Ear: External ear normal.  Mouth/Throat: Oropharynx is clear and moist.  Eyes: Conjunctivae and EOM are normal. Pupils are equal, round, and reactive to light.  Neck: Neck  supple. No JVD present. No tracheal deviation present. No thyromegaly present.  Cardiovascular: Normal rate, regular rhythm and intact distal pulses.  Exam reveals no gallop and no friction rub.   No murmur heard. Pulmonary/Chest: Effort normal. He has no wheezes.  Abdominal: He exhibits no mass. There is no tenderness. There is no rebound and no guarding.  Musculoskeletal: He exhibits no edema or tenderness.  Lymphadenopathy:    He has no cervical adenopathy.  Neurological: He is alert and oriented to person, place, and time.  Skin: Skin is warm and dry.  Psychiatric: He has a normal mood and affect. His behavior is normal. Thought content normal.     Assessment & Plan:   1. Acute renal failure, unspecified acute renal failure type  - COMPLETE METABOLIC PANEL WITH GFR  2. Health care maintenance  - Lipid panel - TSH - Vitamin D 1,25 dihydroxy     Follow-up: Return in about 2 months (around 09/12/2015) for Diabetes, A1C.   Henrietta Hoover FNP

## 2015-07-13 NOTE — Patient Instructions (Signed)
Continue your current medications for your diabetes Come back in 2 months Bring your meter. Come back sooner if you need to.

## 2015-07-16 LAB — VITAMIN D 1,25 DIHYDROXY
VITAMIN D3 1, 25 (OH): 20 pg/mL
Vitamin D 1, 25 (OH)2 Total: 20 pg/mL (ref 18–72)

## 2015-08-10 ENCOUNTER — Encounter: Payer: Self-pay | Admitting: Family Medicine

## 2015-09-18 ENCOUNTER — Ambulatory Visit: Payer: BLUE CROSS/BLUE SHIELD | Admitting: Family Medicine

## 2016-01-19 IMAGING — CR DG CHEST 2V
2 series · 2 of 2 positions shown · non-contrast
Comparison: None.

CLINICAL DATA: Generalized abdominal pain and chest pain for 1
week.

EXAM:
CHEST  2 VIEW

[w chest pa]
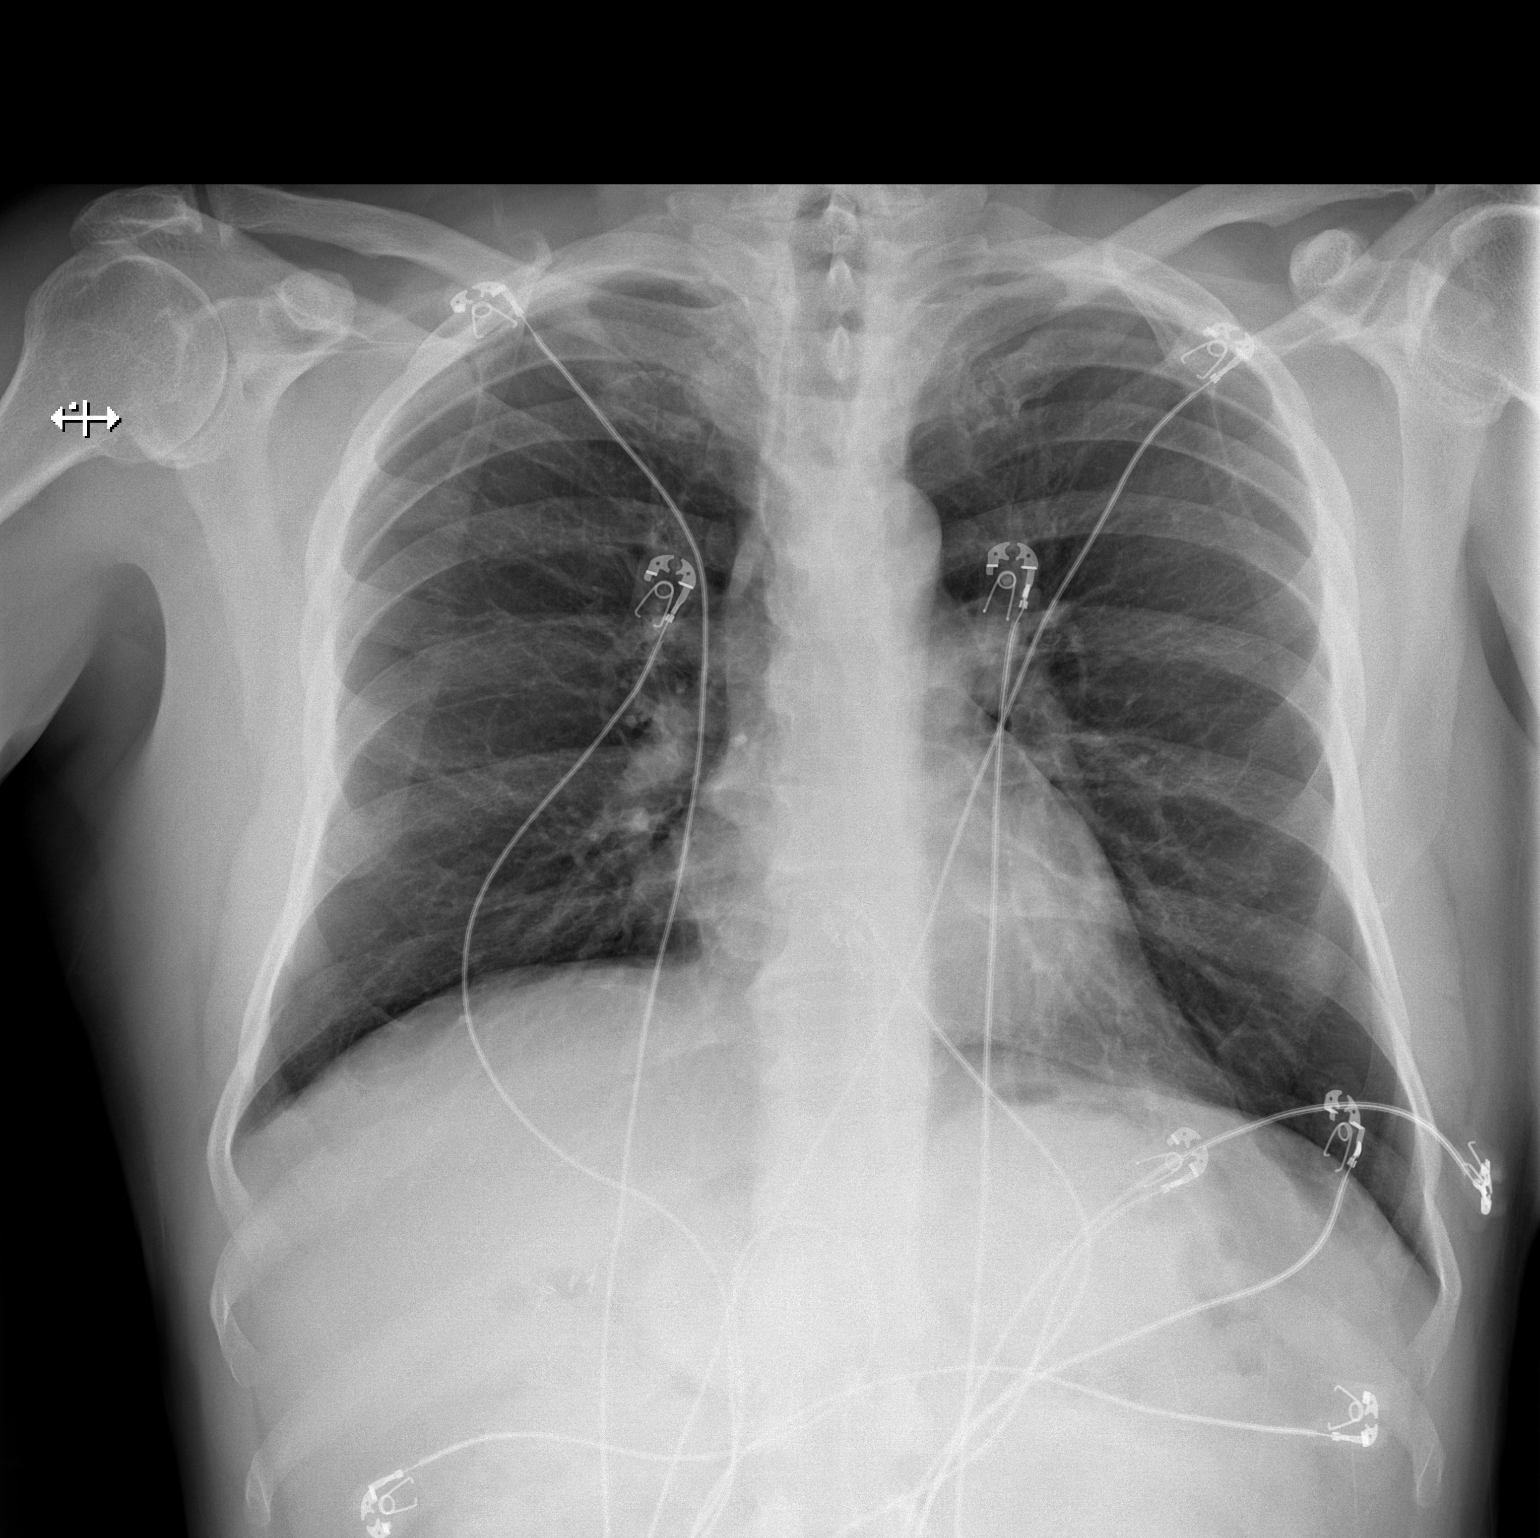

[w chest lat]
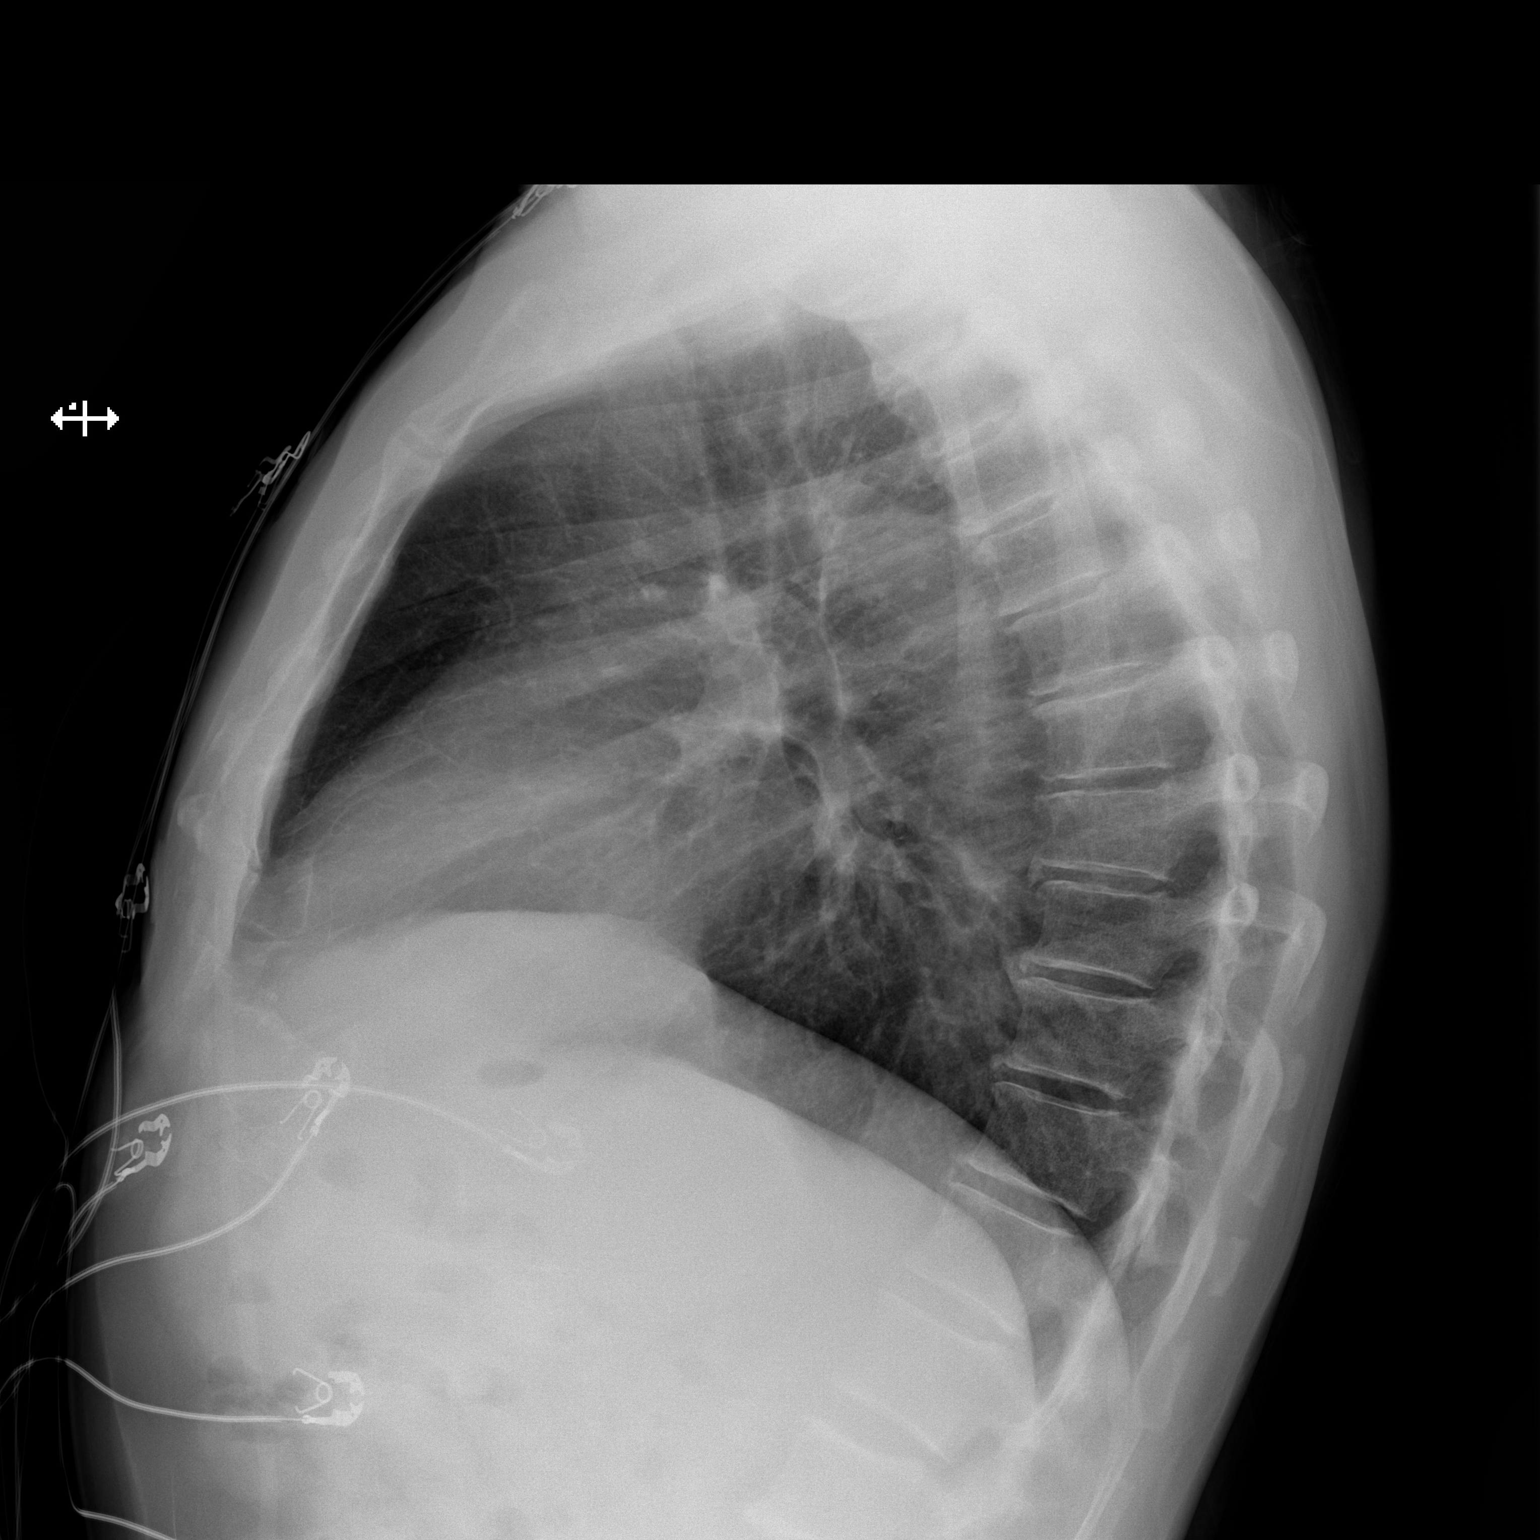

[2 of 2 positions shown; findings below may reference images not displayed]

FINDINGS: The cardiac silhouette, mediastinal and hilar contours are within
normal limits. The lungs are clear. No pleural effusion and or
pneumothorax. The bony thorax is intact.
IMPRESSION: No acute cardiopulmonary findings.

## 2019-06-05 DEATH — deceased
